# Patient Record
Sex: Male | Born: 1945 | Race: White | Marital: Married
Health system: Southern US, Community
[De-identification: ages and names within clinical notes are randomized; demographics above are authoritative.]

---

## 2020-03-12 ENCOUNTER — Ambulatory Visit (INDEPENDENT_AMBULATORY_CARE_PROVIDER_SITE_OTHER): Payer: Medicare PPO | Admitting: Orthopaedic Surgery

## 2020-03-12 VITALS — Ht 65.0 in | Wt 195.0 lb

## 2020-03-12 DIAGNOSIS — M25562 Pain in left knee: Secondary | ICD-10-CM

## 2020-03-12 DIAGNOSIS — M23321 Other meniscus derangements, posterior horn of medial meniscus, right knee: Secondary | ICD-10-CM

## 2020-03-12 DIAGNOSIS — G8929 Other chronic pain: Secondary | ICD-10-CM | POA: Diagnosis not present

## 2020-03-12 MED ORDER — METHYLPREDNISOLONE ACETATE 40 MG/ML IJ SUSP
40.0000 mg | INTRAMUSCULAR | Status: AC | PRN
  Administered 2020-03-12: 40 mg via INTRA_ARTICULAR

## 2020-03-12 MED ORDER — LIDOCAINE HCL 1 % IJ SOLN
3.0000 mL | INTRAMUSCULAR | Status: AC | PRN
  Administered 2020-03-12: 3 mL

## 2020-03-12 NOTE — Progress Notes (Signed)
Office Visit Note   Patient: Ian Warner           Date of Birth: [birth year]           MRN: [id number] Visit Date: 03/12/2020              Requested by: No referring provider defined for this encounter. PCP: Pcp, No   Assessment & Plan: Visit Diagnoses:  1. Chronic pain of left knee   2. Other meniscus derangements, posterior horn of medial meniscus, right knee     Plan:   I was able to aspirate about 25 cc of fluid from his knee and place a steroid injection in his knee.  He is likely a good candidate for arthroscopic surgery.  I would like to see him back in 2 weeks to see how he responded to this injection.  He did report to me that it is pivoting activities it causes him the most issues which will correlate with a meniscal tear with that in the and correlate with the MRI showing a meniscal tear in the left knee medial meniscus.  All questions and concerns were answered and addressed.  We will see how he is doing in 2 weeks. Follow-Up Instructions: Return in about 2 weeks (around 03/26/2020).   Orders:  Orders Placed This Encounter  Procedures  . Large Joint Inj   No orders of the defined types were placed in this encounter.     Procedures: Large Joint Inj: L knee on 03/12/2020 2:17 PM Indications: diagnostic evaluation and pain Details: 22 G 1.5 in needle, superolateral approach  Arthrogram: No  Medications: 3 mL lidocaine 1 %; 40 mg methylPREDNISolone acetate 40 MG/ML Outcome: tolerated well, no immediate complications Procedure, treatment alternatives, risks and benefits explained, specific risks discussed. Consent was given by the patient. Immediately prior to procedure a time out was called to verify the correct patient, procedure, equipment, support staff and site/side marked as required. Patient was prepped and draped in the usual sterile fashion.       Clinical Data: No additional findings.   Subjective: Chief Complaint  Patient presents with  . Left  Knee - Pain  The patient is referred for evaluation treatment of recurrent effusion of his left knee as well as a known medial meniscal tear in that left knee.  He is a very active 74 year old gentleman who likes to walk a lot with his dog as well as walk for exercise.  He also likes to mow his lawn a lot.  He is not able to to do that given the left knee pain he has had.  He had a twisting injury to that knee earlier this year.  He tried a steroid injection and then later hyaluronic acid injection.  An MRI was obtained showing a posterior horn medial meniscal tear as well as thinning of the cartilage of his knee.  There is no full-thickness cartilage defects.  He still has been having some daily discomfort with his left knee.  He denies any acute changes in medical status.  He is never had surgery on the left knee.  HPI  Review of Systems He currently denies any headache, chest pain, shortness of breath, fever, chills, nausea, vomiting  Objective: Vital Signs: Ht 5\' 5"  (1.651 m)   Wt 195 lb (88.5 kg)   BMI 32.45 kg/m   Physical Exam He is alert and orient x3 and in no acute distress Ortho Exam Examination of his right knee is normal.  Examination of his left knee does show mild to moderate effusion.  There is also medial joint line tenderness and pain when rotating the tibia on the femur internally and externally only on the medial joint line. Specialty Comments:  No specialty comments available.  Imaging: No results found. The plain films of the knee shows only mild arthritic changes.  The MRI of the knee does show moderate effusion with a Baker's cyst as well as signal changes in the posterior horn mid body the medial meniscus.  There is thinning of cartilage in the knee but no full-thickness cartilage defects.  PMFS History: There are no problems to display for this patient.  No past medical history on file.  No family history on file.   Social History   Occupational History  .  Not on file  Tobacco Use  . Smoking status: Not on file  Substance and Sexual Activity  . Alcohol use: Not on file  . Drug use: Not on file  . Sexual activity: Not on file

## 2020-03-28 ENCOUNTER — Ambulatory Visit: Payer: Medicare PPO | Admitting: Orthopaedic Surgery

## 2020-03-29 ENCOUNTER — Ambulatory Visit (INDEPENDENT_AMBULATORY_CARE_PROVIDER_SITE_OTHER): Payer: Medicare PPO | Admitting: Orthopaedic Surgery

## 2020-03-29 ENCOUNTER — Encounter: Payer: Self-pay | Admitting: Orthopaedic Surgery

## 2020-03-29 DIAGNOSIS — G8929 Other chronic pain: Secondary | ICD-10-CM

## 2020-03-29 DIAGNOSIS — M25562 Pain in left knee: Secondary | ICD-10-CM | POA: Diagnosis not present

## 2020-03-29 DIAGNOSIS — S83242D Other tear of medial meniscus, current injury, left knee, subsequent encounter: Secondary | ICD-10-CM | POA: Diagnosis not present

## 2020-03-29 NOTE — Progress Notes (Signed)
The patient is continuing to have intermittent locking catching with his left knee that is activity related.  He is 74 years old and very active.  He has a MRI that did show a medial meniscal tear of his knee but no significant cartilage loss in the knee at all.  He says some days are better than others.  We tried a steroid injection and this did not help.  He still has worked on activity modification.  I talked about the possibility of an arthroscopic intervention for his left knee.  Described in detail what this involves including a discussion of his interoperative and postoperative course.  I talked about the risk and benefits of knee arthroscopy as well.  I do feel this is a neck step for him if he becomes more symptomatic.  He has our surgery scheduler's card and said he would let us know if he would like [place] to schedule him for a left knee arthroscopy with a partial medial meniscectomy.

## 2020-05-10 ENCOUNTER — Other Ambulatory Visit: Payer: Self-pay | Admitting: Orthopaedic Surgery

## 2020-05-10 DIAGNOSIS — S83232D Complex tear of medial meniscus, current injury, left knee, subsequent encounter: Secondary | ICD-10-CM | POA: Diagnosis not present

## 2020-05-10 MED ORDER — HYDROCODONE-ACETAMINOPHEN 5-325 MG PO TABS: 1.0000 | ORAL_TABLET | Freq: Four times a day (QID) | ORAL | 0 refills | Status: DC | PRN

## 2020-05-16 ENCOUNTER — Encounter: Payer: Self-pay | Admitting: Orthopaedic Surgery

## 2020-05-16 ENCOUNTER — Ambulatory Visit (INDEPENDENT_AMBULATORY_CARE_PROVIDER_SITE_OTHER): Payer: Medicare PPO | Admitting: Orthopaedic Surgery

## 2020-05-16 DIAGNOSIS — Z9889 Other specified postprocedural states: Secondary | ICD-10-CM

## 2020-05-16 DIAGNOSIS — G8929 Other chronic pain: Secondary | ICD-10-CM

## 2020-05-16 DIAGNOSIS — S83242D Other tear of medial meniscus, current injury, left knee, subsequent encounter: Secondary | ICD-10-CM

## 2020-05-16 DIAGNOSIS — M25562 Pain in left knee: Secondary | ICD-10-CM

## 2020-05-16 NOTE — Progress Notes (Signed)
The patient is 1 week status post a left knee arthroscopy.  He is a 74 year old gentleman.  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  The rest of his knee look good and side.  He is doing well postoperatively.  He is still having some pain with that left knee.  This is to be expected had just 1 week postop.  On examination he does have a moderate left knee effusion.  I felt appropriate to aspirate his knee and place a steroid injection in today.  He is can work on activity modification and can still do his exercise walking but avoid hills.  I would like to see him back in about 4 weeks.  He is a good candidate for hyaluronic acid in the future if needed.

## 2020-05-17 ENCOUNTER — Other Ambulatory Visit: Payer: Self-pay | Admitting: Orthopaedic Surgery

## 2020-05-17 NOTE — Telephone Encounter (Signed)
Please advise 

## 2020-06-12 ENCOUNTER — Ambulatory Visit (INDEPENDENT_AMBULATORY_CARE_PROVIDER_SITE_OTHER): Payer: Medicare PPO | Admitting: Orthopaedic Surgery

## 2020-06-12 ENCOUNTER — Encounter: Payer: Self-pay | Admitting: Orthopaedic Surgery

## 2020-06-12 DIAGNOSIS — Z9889 Other specified postprocedural states: Secondary | ICD-10-CM

## 2020-06-12 NOTE — Progress Notes (Signed)
HPI: Mr. Cosma returns 5 weeks status post left knee arthroscopy for a complex medial meniscal tear and grade III chondromalacia medial femoral condyle.  He states overall the knee is starting to improve.  He also is having occasional numbness in his calf region and some back pain he brings with him an MRI report that was done at another facility images not available and asked that we look at this that shows that he has degenerative changes in the lumbar spine but only mild foraminal stenosis at the lower levels bilaterally at L5-S1 and mildly on the right at L4-5 per the report.  He denies any radicular symptoms down the leg but does have low back pain at times.  Physical exam: Left knee no abnormal warmth erythema.  Good range of motion with slight crepitus.  Nontender along medial lateral joint line.  No effusion.   Impression: 5 weeks status post left knee arthroscopy Low back pain without radicular pain  Plan: Did give him some exercises for his back to do on his own.  He will discuss with his primary care physician whether he can benefit from outpatient physical therapy as his primary care physician did order the MRI of his back.  Quad strengthening exercises discussed with him.  We will see him back in 3 months he may consider a supplemental injection in the future.  Questions encouraged and answered by Dr. Magnus Ivan and myself.

## 2020-11-21 ENCOUNTER — Telehealth: Payer: Self-pay

## 2020-11-21 ENCOUNTER — Ambulatory Visit (INDEPENDENT_AMBULATORY_CARE_PROVIDER_SITE_OTHER): Payer: Medicare PPO | Admitting: Orthopaedic Surgery

## 2020-11-21 DIAGNOSIS — G8929 Other chronic pain: Secondary | ICD-10-CM | POA: Diagnosis not present

## 2020-11-21 DIAGNOSIS — M25562 Pain in left knee: Secondary | ICD-10-CM

## 2020-11-21 DIAGNOSIS — M1712 Unilateral primary osteoarthritis, left knee: Secondary | ICD-10-CM | POA: Diagnosis not present

## 2020-11-21 NOTE — Telephone Encounter (Signed)
Left knee gel injection ?

## 2020-11-21 NOTE — Progress Notes (Signed)
The patient is well-known to me.  He is an active 75 year old gentleman who is 6 months out from a knee arthroscopy for his left knee.  We found grade III chondromalacia of the medial compartment the knee and a medial meniscal tear.  The ACL and PCL were intact.  The lateral compartment and patellofemoral joint were also intact.  He is still having some knee pain and sometimes pain in his shin.  Is more so going up and down stairs that bothers him.  He denies any locking catching.  He has had no other acute change in medical status.  He is an active 75 year old gentleman.  Examination of his left knee shows no effusion.  There is slight varus malalignment and some medial joint line tenderness.  The knee is ligamentously stable.  It hurts throughout the arc of motion of the knee.  I did go over his arthroscopy pictures.  Given the significant chondromalacia the medial compartment of his knee, at this point he is a candidate for hyaluronic acid for that knee.  He has tried and failed other forms of conservative treatment including activity modification, anti-inflammatories, steroid injections, and even arthroscopic intervention.  Given the arthritic findings this is the next step for him and he agrees with this as well.  We will hopefully submit for hyaluronic acid for the left knee and call him when it is approved and comes in.  All questions and concerns were answered and addressed.

## 2020-11-22 NOTE — Telephone Encounter (Signed)
Noted  

## 2020-12-14 ENCOUNTER — Telehealth: Payer: Self-pay

## 2020-12-14 ENCOUNTER — Telehealth: Payer: Self-pay | Admitting: Orthopaedic Surgery

## 2020-12-14 NOTE — Telephone Encounter (Signed)
Pt called wanting to know where he is in the process of getting approved for the ge injection. He would would like a CB to discuss further.   [phone]

## 2020-12-14 NOTE — Telephone Encounter (Signed)
Approved for Monovisc, left knee. Buy & Bill Patient will be responsible for 20% OOP. No Co-pay PA Approval# [id number] Valid 12/14/2020- 03/14/2021  Appt. 12/20/2020 with Bronson Curb

## 2020-12-14 NOTE — Telephone Encounter (Signed)
Talked patient cocnerning gel injection.  Appt. Scheduled for 12/20/2020

## 2020-12-14 NOTE — Telephone Encounter (Signed)
VOB has been submitted for Monovisc, left knee. Pending BV PA approved for Monovisc, left knee through [facility]

## 2020-12-20 ENCOUNTER — Encounter: Payer: Self-pay | Admitting: Physician Assistant

## 2020-12-20 ENCOUNTER — Ambulatory Visit: Payer: Medicare PPO | Admitting: Physician Assistant

## 2020-12-20 DIAGNOSIS — M1712 Unilateral primary osteoarthritis, left knee: Secondary | ICD-10-CM

## 2020-12-20 MED ORDER — LIDOCAINE HCL 1 % IJ SOLN
4.0000 mL | INTRAMUSCULAR | Status: AC | PRN
  Administered 2020-12-20: 4 mL

## 2020-12-20 MED ORDER — HYALURONAN 88 MG/4ML IX SOSY
88.0000 mg | PREFILLED_SYRINGE | INTRA_ARTICULAR | Status: AC | PRN
  Administered 2020-12-20: 88 mg via INTRA_ARTICULAR

## 2020-12-20 NOTE — Progress Notes (Signed)
   Procedure Note  Patient: Colen Eltzroth             Date of Birth: 07/24/1945           MRN: [id number]             Visit Date: 12/20/2020 HPI: Mr. Buenger returns today for scheduled Monovisc injection left knee.  He also feels that he has fluid on the knee and would like it aspirated.  Again he has known osteoarthritis of the left knee.  On knee arthroscopy he had grade III chondromalacia of the medial compartment of the knee and also had a medial meniscal tear for which he underwent partial medial meniscectomy.  Continues to have pain in the left knee with swelling.  He has had no new injury.  States that the knee pain is limiting his activities.  He has no scheduled knee arthroplasty arthroplasty in the next 6 months.  Physical exam: Left knee good range of motion left knee.  Slight effusion no abnormal warmth or erythema.  No instability valgus varus stressing.  Procedures: Visit Diagnoses:  1. Primary osteoarthritis of left knee     Large Joint Inj: L knee on 12/20/2020 5:22 PM Indications: pain Details: 22 G 1.5 in needle, superolateral approach  Arthrogram: No  Medications: 88 mg Hyaluronan 88 MG/4ML; 4 mL lidocaine 1 % Aspirate: 12 mL yellow Outcome: tolerated well, no immediate complications Procedure, treatment alternatives, risks and benefits explained, specific risks discussed. Consent was given by the patient. Immediately prior to procedure a time out was called to verify the correct patient, procedure, equipment, support staff and site/side marked as required. Patient was prepped and draped in the usual sterile fashion.    Plan: We will see him back in 8 weeks to see what type of response he had to the supplemental injection.  He will continue work on Dance movement psychotherapist.  Discussed knee friendly exercises with him.  Questions encouraged and answered.

## 2021-02-13 ENCOUNTER — Other Ambulatory Visit: Payer: Self-pay

## 2021-02-13 ENCOUNTER — Ambulatory Visit (INDEPENDENT_AMBULATORY_CARE_PROVIDER_SITE_OTHER): Payer: Medicare PPO | Admitting: Orthopaedic Surgery

## 2021-02-13 ENCOUNTER — Encounter: Payer: Self-pay | Admitting: Orthopaedic Surgery

## 2021-02-13 DIAGNOSIS — M1712 Unilateral primary osteoarthritis, left knee: Secondary | ICD-10-CM | POA: Diagnosis not present

## 2021-02-13 NOTE — Progress Notes (Signed)
HPI: Ian Warner returns today for follow-up of his left knee.  He does not feel that the supplemental injections worked much.  Does note some swelling.  He has painful popping in the knee.  Underwent Monovisc injection 12/20/2020.  He also is having some back pain and has radicular symptoms over the same medial aspect of the left lower leg.  So is not quite sure if pain is actually coming from his knee or from the back.  Knee arthroscopy November 21 showed chondromalacia grade 3 involving the medial femoral condyle.  He also had a large meniscal tear which he underwent partial meniscectomy for.  Review of systems see HPI otherwise negative or noncontributory.  Left knee good range of motion.  Tenderness along medial joint line.  Slight effusion no abnormal warmth erythema.  No gross instability.  Patellofemoral crepitus with passive range of motion.  Quad atrophy left leg.  Impression: Left knee osteoarthritis Quad atrophy Low back pain with radicular symptoms   Plan: We will send him to formal physical therapy for his knee.  Also for his back he does see his neurologist in the near future and possible epidural steroid injection.  See him back in 6 weeks to see how he is doing overall.  Questions encouraged and answered at length by Dr. Magnus Ivan and myself.

## 2021-03-22 ENCOUNTER — Other Ambulatory Visit (HOSPITAL_COMMUNITY): Payer: Self-pay | Admitting: Neurological Surgery

## 2021-03-22 DIAGNOSIS — R600 Localized edema: Secondary | ICD-10-CM

## 2021-03-25 ENCOUNTER — Ambulatory Visit (HOSPITAL_COMMUNITY)
Admission: RE | Admit: 2021-03-25 | Discharge: 2021-03-25 | Disposition: A | Discharge status: Home / Self Care | Payer: Medicare PPO | Source: Ambulatory Visit | Attending: Neurological Surgery | Admitting: Neurological Surgery

## 2021-03-25 ENCOUNTER — Other Ambulatory Visit: Payer: Self-pay

## 2021-03-25 DIAGNOSIS — R600 Localized edema: Secondary | ICD-10-CM | POA: Diagnosis present

## 2021-04-03 ENCOUNTER — Ambulatory Visit: Payer: Medicare PPO | Admitting: Orthopaedic Surgery

## 2021-06-19 ENCOUNTER — Ambulatory Visit: Payer: Medicare PPO | Admitting: Orthopaedic Surgery

## 2021-06-19 ENCOUNTER — Other Ambulatory Visit: Payer: Self-pay

## 2021-06-19 DIAGNOSIS — M25562 Pain in left knee: Secondary | ICD-10-CM | POA: Diagnosis not present

## 2021-06-19 DIAGNOSIS — G8929 Other chronic pain: Secondary | ICD-10-CM

## 2021-06-19 DIAGNOSIS — M1712 Unilateral primary osteoarthritis, left knee: Secondary | ICD-10-CM | POA: Diagnosis not present

## 2021-06-19 NOTE — Progress Notes (Signed)
The patient comes in today with continued left knee pain he is 76 years old and very active.  He says some of the knee certainly hurts him quite a bit.  He was seen for radicular symptoms by neurosurgery as it relates to his lumbar spine and it did not feel that there was anything in the need to do for his spine which I think is reasonable based on their notes.  He did have a Doppler to rule out DVT of the left lower extremity.  On exam he still has some left knee swelling.  I have aspirated this knee on several occasions and provided steroid injections and hyaluronic acid injections.  He has had an arthroscopic intervention as well.  We did find grade III chondromalacia of the medial compartment of his knee.  At this point we will continue pain in his knee we need to obtain an MRI of the left knee to assess the cartilage and rule out any type of subchondral edema anterior be able to better ascertain as to the degree of cartilage loss that may lead [place] to recommending a knee replacement.  He agrees with this treatment plan.  We will see him back once we have the MRI of his left knee.  On his follow-up visit I would like to have a standing AP and lateral of his left knee.

## 2021-06-30 ENCOUNTER — Other Ambulatory Visit: Payer: Medicare PPO

## 2021-07-06 ENCOUNTER — Ambulatory Visit
Admission: RE | Admit: 2021-07-06 | Discharge: 2021-07-06 | Disposition: A | Discharge status: Home / Self Care | Payer: Medicare PPO | Source: Ambulatory Visit | Attending: Orthopaedic Surgery | Admitting: Orthopaedic Surgery

## 2021-07-06 ENCOUNTER — Other Ambulatory Visit: Payer: Self-pay

## 2021-07-06 DIAGNOSIS — G8929 Other chronic pain: Secondary | ICD-10-CM

## 2021-07-06 DIAGNOSIS — M25562 Pain in left knee: Secondary | ICD-10-CM

## 2021-07-10 ENCOUNTER — Encounter: Payer: Self-pay | Admitting: Orthopaedic Surgery

## 2021-07-10 ENCOUNTER — Other Ambulatory Visit: Payer: Self-pay

## 2021-07-10 ENCOUNTER — Ambulatory Visit (INDEPENDENT_AMBULATORY_CARE_PROVIDER_SITE_OTHER): Payer: Medicare PPO | Admitting: Orthopaedic Surgery

## 2021-07-10 DIAGNOSIS — M25562 Pain in left knee: Secondary | ICD-10-CM | POA: Diagnosis not present

## 2021-07-10 DIAGNOSIS — G8929 Other chronic pain: Secondary | ICD-10-CM

## 2021-07-10 NOTE — Progress Notes (Signed)
The patient comes in with continued left knee pain and to go over MRI of his left knee.  He was sent to me from neurosurgery due to some neurologic symptoms he was having but his knee did have some swelling.  His pain seems to be global.  Sometimes is medial.  Sometimes he stiff when he gets out of a car.  On my exam today there is really no effusion of his left knee and is patella tracks just slightly lateral he has good range of motion of his knee overall and there is no instability on my exam.  MRIs reviewed and it does not show any tear of the meniscus.  The ligaments including the ACL and PCL are intact.  There is only some slight cartilage thinning throughout the knee but no areas of full-thickness cartilage loss.  There is some edema in Hoffa's fat pad and that is where he may be experiencing some of his pain as well.  Some of his pain is also along the medial joint line and some on the medial femoral condyle.  At this point I would like to send him to my partner Dr. Sammuel Hines for an opinion as it relates to this patient's left knee.  He certainly still may end up benefiting from an arthroscopic intervention for debridement of Hoffa's fat pad and looking for a medial plica.  I would like to defer to my partners expertise in treating this issue.  The patient agrees with a second opinion like this as well.

## 2021-07-12 ENCOUNTER — Ambulatory Visit (HOSPITAL_BASED_OUTPATIENT_CLINIC_OR_DEPARTMENT_OTHER)
Admission: RE | Admit: 2021-07-12 | Discharge: 2021-07-12 | Disposition: A | Discharge status: Home / Self Care | Payer: Medicare PPO | Source: Ambulatory Visit | Attending: Orthopaedic Surgery | Admitting: Orthopaedic Surgery

## 2021-07-12 ENCOUNTER — Other Ambulatory Visit: Payer: Self-pay

## 2021-07-12 ENCOUNTER — Other Ambulatory Visit (HOSPITAL_BASED_OUTPATIENT_CLINIC_OR_DEPARTMENT_OTHER): Payer: Self-pay | Admitting: Orthopaedic Surgery

## 2021-07-12 ENCOUNTER — Ambulatory Visit (INDEPENDENT_AMBULATORY_CARE_PROVIDER_SITE_OTHER): Payer: Medicare PPO | Admitting: Orthopaedic Surgery

## 2021-07-12 DIAGNOSIS — G8929 Other chronic pain: Secondary | ICD-10-CM

## 2021-07-12 DIAGNOSIS — M25562 Pain in left knee: Secondary | ICD-10-CM

## 2021-07-12 DIAGNOSIS — S86112A Strain of other muscle(s) and tendon(s) of posterior muscle group at lower leg level, left leg, initial encounter: Secondary | ICD-10-CM | POA: Diagnosis not present

## 2021-07-12 NOTE — Progress Notes (Signed)
Chief Complaint: Left knee pain     History of Present Illness:    Ian Warner is a 76 y.o. male patient previously seen with Dr. Magnus Ivan who has persistently had left knee pain for several years now.  He did previously undergo medial meniscal debridement without any relief.  He enjoys regularly walking about 3 miles a day although is limited as result of the knee pain.  He has had injections in the past which helped for a small period of time.  He has done physical therapy and home exercises which have not given him significant relief.  He was previously evaluated by neurosurgical team who did not recommend any specific intervention for the back.  Here today for further assessment and evaluation.    Surgical History:   Left knee arthroscopy with medial meniscal debridement 2021 with Dr. Magnus Ivan  PMH/PSH/Family History/Social History/Meds/Allergies:   No past medical history on file. No past surgical history on file. Social History   Socioeconomic History   Marital status: Married    Spouse name: Not on file   Number of children: Not on file   Years of education: Not on file   Highest education level: Not on file  Occupational History   Not on file  Tobacco Use   Smoking status: Not on file   Smokeless tobacco: Not on file  Substance and Sexual Activity   Alcohol use: Not on file   Drug use: Not on file   Sexual activity: Not on file  Other Topics Concern   Not on file  Social History Narrative   Not on file   Social Determinants of Health   Financial Resource Strain: Not on file  Food Insecurity: Not on file  Transportation Needs: Not on file  Physical Activity: Not on file  Stress: Not on file  Social Connections: Not on file   No family history on file. Not on File Current Outpatient Medications  Medication Sig Dispense Refill   HYDROcodone-acetaminophen (NORCO/VICODIN) 5-325 MG tablet TAKE 1-2 TABLETS BY MOUTH EVERY 6  HOURS AS NEEDED FOR MODERATE PAIN 30 tablet 0   No current facility-administered medications for this visit.   No results found.  Review of Systems:   A ROS was performed including pertinent positives and negatives as documented in the HPI.  Physical Exam :   Constitutional: NAD and appears stated age Neurological: Alert and oriented Psych: Appropriate affect and cooperative There were no vitals taken for this visit.   Comprehensive Musculoskeletal Exam:      Musculoskeletal Exam  Gait Normal  Alignment Normal   Right Left  Inspection Normal Normal  Palpation    Tenderness None Lateral head of the gastroc  Crepitus None None  Effusion  None  Range of Motion    Extension 0 0  Flexion 135 135  Strength    Extension 5/5 5/5  Flexion 5/5 5/5  Ligament Exam     Generalized Laxity No No  Lachman Negative Negative   Pivot Shift Negative Negative  Anterior Drawer Negative Negative  Valgus at 0 Negative Negative  Valgus at 20 Negative Negative  Varus at 0 0 0  Varus at 20   0 0  Posterior Drawer at 90 0 0  Vascular/Lymphatic Exam    Edema None None  Venous Stasis Changes No  No  Distal Circulation Normal Normal  Neurologic    Light Touch Sensation Intact Intact  Special Tests: Tenderness predominantly when brought into full extension at the lateral head of the gastroc     Imaging:   Xray (4 views left knee): Normal  MRI (left knee): There is fluid and edema about the lateral head of the gastroc tendon  I personally reviewed and interpreted the radiographs.   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  He does have a tight heel cord on the left compared to the right.  We discussed that ultimately getting his gastroc stretched out I believe we give him the most significant amount of pain relief.  To that effect I would also like to offer him an ultrasound-guided injection into this area to give him some relief and allow him to  better work with physical therapy.  I will see him back in 1 month for reevaluation Plan :    -Return to clinic in 1 month for reevaluation -PT ordered for gastroc lateral head mobilization and eccentric stretching as well as heel cord stretching     I personally saw and evaluated the patient, and participated in the management and treatment plan.  Huel Cote, MD Attending Physician, Orthopedic Surgery  This document was dictated using Dragon voice recognition software. A reasonable attempt at proof reading has been made to minimize errors.

## 2021-07-25 ENCOUNTER — Encounter (HOSPITAL_BASED_OUTPATIENT_CLINIC_OR_DEPARTMENT_OTHER): Payer: Self-pay | Admitting: Physical Therapy

## 2021-07-25 ENCOUNTER — Other Ambulatory Visit: Payer: Self-pay

## 2021-07-25 ENCOUNTER — Ambulatory Visit (HOSPITAL_BASED_OUTPATIENT_CLINIC_OR_DEPARTMENT_OTHER): Payer: Medicare PPO | Attending: Orthopaedic Surgery | Admitting: Physical Therapy

## 2021-07-25 DIAGNOSIS — M25672 Stiffness of left ankle, not elsewhere classified: Secondary | ICD-10-CM | POA: Insufficient documentation

## 2021-07-25 DIAGNOSIS — M25662 Stiffness of left knee, not elsewhere classified: Secondary | ICD-10-CM | POA: Diagnosis not present

## 2021-07-25 DIAGNOSIS — M25562 Pain in left knee: Secondary | ICD-10-CM | POA: Insufficient documentation

## 2021-07-25 DIAGNOSIS — R262 Difficulty in walking, not elsewhere classified: Secondary | ICD-10-CM | POA: Diagnosis not present

## 2021-07-25 DIAGNOSIS — G8929 Other chronic pain: Secondary | ICD-10-CM | POA: Insufficient documentation

## 2021-07-25 DIAGNOSIS — M6281 Muscle weakness (generalized): Secondary | ICD-10-CM | POA: Diagnosis not present

## 2021-07-25 NOTE — Therapy (Signed)
OUTPATIENT PHYSICAL THERAPY LOWER EXTREMITY EVALUATION   Patient Name: Ian Warner MRN: [id number] DOB:04-Aug-1945, 76 y.o., male Today's Date: 07/25/2021   PT End of Session - 07/25/21 1121     Visit Number 1    Number of Visits 10    Date for PT Re-Evaluation 09/05/21    Authorization Type [employer]    PT Start Time 1021    PT Stop Time 1114    PT Time Calculation (min) 53 min    Activity Tolerance Patient tolerated treatment well    Behavior During Therapy [facility] for tasks assessed/performed             History reviewed. No pertinent past medical history. History reviewed. No pertinent surgical history. Patient Active Problem List   Diagnosis Date Noted   Status post arthroscopy of left knee 05/16/2020    PCP: Corrington, Meredith Mody, MD  REFERRING PROVIDER: Huel Cote, MD  REFERRING DIAG: [phone] (ICD-10-CM) - Chronic pain of left knee   THERAPY DIAG:  Chronic pain of left knee  Stiffness of left knee, not elsewhere classified  Muscle weakness (generalized)  Stiffness of left ankle, not elsewhere classified  Difficulty in walking, not elsewhere classified  ONSET DATE: MD visit/script 07/12/2021  SUBJECTIVE:   SUBJECTIVE STATEMENT: -Pt has had left knee pain for a couple of years with no specific MOI.  Pt reports having progressively worsening knee pain.  He has done physical therapy and home exercises which did not help.  Pt has L knee OA and was seeing Dr. Magnus Ivan.  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  Pt has received prior knee Monovisc injection and knee aspirations which didn't provide relief.  Pt reports no relief with surgery, injection, and aspirations.    -Dr. Magnus Ivan ordered a MRI and went over MRI with Pt.  MD note indicated some edema in Hoffa's fat pad and that is where he may be experiencing some of his pain as well. Some  of his pain is also along the medial joint line and some on the medial femoral condyle.   He then referred pt to Dr. Steward Drone.  Dr. 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  MD ordered PT with MD note indicated gastroc lateral head mobilization and eccentric stretching as well as heel cord stretching for L gastrocnemius tendonitis.  He received an US guided injection and reports improved sx's since injection.   -Pt states his wife states that he is sleeping better since the injection and he reports improved pain with sitting since injection.  Pt states his pain is primarily in posteromedial knee and travels down calf toward ankle.  Pt has increased pain with performing stairs and ambulation.  Pt is limited with ambulation due to pain.  Pt is limited with standing duration due to pain and limits his performance of stairs.  Difficulty with vacuuming.   Pt states he has had back pain for a few years and recently was evaluated by neurosurgery, Dr. Danielle Dess.  He was given ESI's and reports improved back pain since.    PERTINENT HISTORY: L knee OA, L knee arthroscopy with partial medial meniscectomy on December 2021, back pain  PAIN:  Are you having pain? Yes NPRS scale: 3/10 current and best ; 9/10 worst Pain location: L post  knee, L calf, and anterior knee Pain description: constant  Aggravating factors: standing, stairs, walking Relieving factors: US guided injection  PRECAUTIONS: L knee OA, hx of back pain  WEIGHT BEARING RESTRICTIONS No  FALLS:  Has patient fallen in last 6 months? No  LIVING ENVIRONMENT: Lives with: lives with their spouse Lives in: 2 story home Stairs: Yes   OCCUPATION: Pt is retired.   PLOF: Independent  PATIENT GOALS eliminate pain   OBJECTIVE:   DIAGNOSTIC FINDINGS:  MRI:  IMPRESSION: 1. Expanded ACL favoring ACL cyst with ACL  degeneration. 2. Moderate knee effusion and moderate-sized Baker's cyst. 3. Mild fibrosis in Hoffa's fat pad. 4. Degeneration in the posterior horn medial meniscus without a well-defined tear. 5. Moderate to prominent degenerative chondral thinning in the medial compartment. Mild chondral thinning in the lateral compartment. 6. Mild patellar tendinopathy.  X rays: IMPRESSION: Mild degenerative change with medial tibiofemoral joint space narrowing and trace lateral patellar spurring. Small to moderate knee joint effusion.  PATIENT SURVEYS:  FOTO 56 with a goal of 64 at visit #13  COGNITION:  Overall cognitive status: Within functional limits for tasks assessed      MUSCLE LENGTH: Tightness in L longsitting gastroc stretch.  Pt felt a stretch on L and none on R.     PALPATION: TTP:  lateral proximal calf > medial proximal calf, posterior knee, and minimally in lateral knee.  LE AROM/PROM:  A/PROM Right 07/25/2021 Left 07/25/2021  Hip flexion    Hip extension    Hip abduction    Hip adduction    Hip internal rotation    Hip external rotation    Knee flexion 130 115/117  Knee extension 0 3/0  Ankle dorsiflexion 17 10  Ankle plantarflexion    Ankle inversion    Ankle eversion     (Blank rows = not tested)  LE MMT:  MMT Right 07/25/2021 Left 07/25/2021  Hip flexion 4+/5 4/5  Hip extension    Hip abduction 5/5 4/5  Hip adduction    Hip internal rotation    Hip external rotation    Knee flexion 5/5 tested in sitting 5/5 tested in sitting  Knee extension 5/5 4/5  Ankle dorsiflexion    Ankle plantarflexion    Ankle inversion    Ankle eversion     (Blank rows = not tested)    GAIT: Assistive device utilized: None Level of assistance: Complete Independence Comments: Pt ambulates with a forward flexed posture and minimally favors L LE with increased stance time on R LE.  He reports 5/10 pain with ambulation    TODAY'S TREATMENT: Pt performed longsitting  gastroc stretch with strap 2x20-30 seconds, standing gastroc stretch on wall 2x20-30 sec,  And supine SLR x10 reps.     PATIENT EDUCATION:  Education details:  Dx, POC, objective findings, rationale of exercises.  Pt received a HEP handout and was educated in correct form and appropriate frequency.  Pt instructed to not perform stretching into a painful range and he should not have increased pain with HEP.   Person educated: Patient Education method: Explanation, Demonstration, Tactile cues, Verbal cues, and Handouts Education comprehension: verbalized understanding, returned demonstration, verbal cues required, tactile cues required, and needs further education   HOME EXERCISE PROGRAM: Access Code: [email or web address] URL: [email or web address] Date: 07/25/2021 Prepared by: Aaron Edelman  Exercises Long Sitting Calf Stretch with Strap - 2 x daily - 7 x weekly - 2 reps - 20-30 second hold Gastroc Stretch on Wall - 2 x  daily - 7 x weekly - 2 reps - 20-30 seconds hold Supine Active Straight Leg Raise - 1 x daily - 5-6 x weekly - 2 sets - 10 reps   ASSESSMENT:  CLINICAL IMPRESSION: Patient is a 76 y.o. 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  Pt has had chronic pain and had no relief with surgery, prior injection and aspirations, and prior PT.  Pt received an US guided injection recently from MD and reports much improved sx's.  Pt has increased pain with performing stairs and ambulation and is limited with ambulation due to pain.  Pt is limited with standing duration due to pain and limits his performance of stairs.  Pt was recently evaluated by neurosurgery due to back pain and received ESI's which have improved back pain.  Pt should benefit from skilled PT services to address impairments and improve overall function.          OBJECTIVE IMPAIRMENTS Abnormal gait, decreased activity tolerance, decreased endurance, decreased mobility, difficulty walking, decreased ROM, decreased strength, hypomobility, impaired flexibility, and pain.   ACTIVITY LIMITATIONS  ambulation, stairs, and vacuuming .   PERSONAL FACTORS Time since onset of injury/illness/exacerbation and 1 comorbidity: back pain  are also affecting patient's functional outcome.    REHAB POTENTIAL: Good  CLINICAL DECISION MAKING: Stable/uncomplicated  EVALUATION COMPLEXITY: Low   GOALS:   SHORT TERM GOALS:  STG Name Target Date Goal status  1 Pt will be independent and compliant with HEP for improved pain, ROM, strength, and function.  Baseline:  08/15/2021 INITIAL  2 Pt will demo improved L knee extension AROM to 0 deg and L ankle DF AROM to at least 15 deg for improved mobility, stiffness, and gait.  Baseline:  08/15/2021 INITIAL  3 Pt will report at least a 25% improvement in pain with standing activities and ambulation. Baseline: 08/15/2021 INITIAL                       LONG TERM GOALS:   LTG Name Target Date Goal status  1 Pt will ambulate with = stance time and not favoring L LE.   Baseline: 09/05/2021 INITIAL  2 Pt will demo improved L hip flex, hip abd, and knee ext strength to 5/5 MMT for improved tolerance with and performance of functional mobility.  Baseline: 09/05/2021 INITIAL  3 Pt will report improved tolerance with standing activities including being able to perform his household chores and IADLs without significant pain.  Baseline: 09/05/2021 INITIAL  4 Pt will be able to ambulate extended community distance without increased knee or L LE pain.  Baseline: 09/05/2021 INITIAL  5 Pt will report he is able to perform stairs without significant pain and demo good control with stairs.  Baseline: 09/05/2021 INITIAL             PLAN: PT FREQUENCY: 1-2x/week  PT DURATION: 6 weeks  PLANNED INTERVENTIONS: Therapeutic exercises,  Therapeutic activity, Neuro Muscular re-education, Gait training, Patient/Family education, Joint mobilization, Stair training, Aquatic Therapy, Dry Needling, Electrical stimulation, Cryotherapy, Moist heat, Taping, Ultrasound, and Manual therapy  PLAN FOR NEXT SESSION: Review HEP.  MD note indicated stretching and lateral head mobiliation achilles stretching for L gastrocnemius tendonitis.  Cont with knee ROM also and quad/hip strengthening.  Manual techniques for improved tightness  and mobility.    Audie Clear III PT, DPT 07/25/21 10:06 PM

## 2021-07-31 ENCOUNTER — Ambulatory Visit (HOSPITAL_BASED_OUTPATIENT_CLINIC_OR_DEPARTMENT_OTHER): Payer: Medicare PPO | Admitting: Physical Therapy

## 2021-07-31 ENCOUNTER — Other Ambulatory Visit: Payer: Self-pay

## 2021-07-31 ENCOUNTER — Encounter (HOSPITAL_BASED_OUTPATIENT_CLINIC_OR_DEPARTMENT_OTHER): Payer: Self-pay | Admitting: Physical Therapy

## 2021-07-31 DIAGNOSIS — M6281 Muscle weakness (generalized): Secondary | ICD-10-CM

## 2021-07-31 DIAGNOSIS — G8929 Other chronic pain: Secondary | ICD-10-CM

## 2021-07-31 DIAGNOSIS — M25662 Stiffness of left knee, not elsewhere classified: Secondary | ICD-10-CM

## 2021-07-31 DIAGNOSIS — M25562 Pain in left knee: Secondary | ICD-10-CM | POA: Diagnosis not present

## 2021-07-31 NOTE — Therapy (Signed)
OUTPATIENT PHYSICAL THERAPY TREATMENT NOTE   Patient Name: Ian Warner MRN: [id number] DOB:1946-05-10, 76 y.o., male Today's Date: 07/31/2021  PCP: Vivien Presto, MD REFERRING PROVIDER: Huel Cote, MD   PT End of Session - 07/31/21 1428     Visit Number 2    Number of Visits 10    Date for PT Re-Evaluation 09/05/21    Authorization Type [employer]    PT Start Time 1430    PT Stop Time 1511    PT Time Calculation (min) 41 min    Activity Tolerance Patient tolerated treatment well    Behavior During Therapy [facility] for tasks assessed/performed             History reviewed. No pertinent past medical history. History reviewed. No pertinent surgical history. Patient Active Problem List   Diagnosis Date Noted   Status post arthroscopy of left knee 05/16/2020    REFERRING DIAG: M25.562,G89.29 (ICD-10-CM) - Chronic pain of left knee  THERAPY DIAG:  Chronic pain of left knee  Stiffness of left knee, not elsewhere classified  Muscle weakness (generalized)  PERTINENT HISTORY: L knee OA, L knee arthroscopy with partial medial meniscectomy on December 2021, back pain  PRECAUTIONS: L knee OA, hx of back pain  SUBJECTIVE: stretches were good and I have been doing them. I have 2 dogs that I walk daily.   PAIN:  Are you having pain? Yes NPRS scale: 5/10 Pain location: medial, left calf  Pain description: sharp, stabbing, numbness Aggravating factors: being in a car Relieving factors: stretching helped some    OBJECTIVE:    DIAGNOSTIC FINDINGS:  MRI:  IMPRESSION: 1. 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 2. Moderate knee effusion and moderate-sized Baker's cyst. 3. Mild fibrosis in Hoffa's fat pad. 4. Degeneration in the posterior horn medial meniscus without a well-defined tear. 5. Moderate to prominent degenerative chondral thinning in the medial compartment. Mild chondral thinning in the lateral compartment. 6. Mild patellar  tendinopathy.   X rays: IMPRESSION: Mild degenerative change with medial tibiofemoral joint space narrowing and trace lateral patellar spurring. Small to moderate knee joint effusion.   PATIENT SURVEYS:  FOTO 56 with a goal of 64 at visit #13   COGNITION:          Overall cognitive status: Within functional limits for tasks assessed                          MUSCLE LENGTH: Tightness in L longsitting gastroc stretch.  Pt felt a stretch on L and none on R.        PALPATION: TTP:  lateral proximal calf > medial proximal calf, posterior knee, and minimally in lateral knee.   LE AROM/PROM:   A/PROM Right 07/25/2021 Left 07/25/2021  Hip flexion      Hip extension      Hip abduction      Hip adduction      Hip internal rotation      Hip external rotation      Knee flexion 130 115/117  Knee extension 0 3/0  Ankle dorsiflexion 17 10  Ankle plantarflexion      Ankle inversion      Ankle eversion       (Blank rows = not tested)   LE MMT:   MMT Right 07/25/2021 Left 07/25/2021  Hip flexion 4+/5 4/5  Hip extension      Hip abduction 5/5 4/5  Hip adduction  Hip internal rotation      Hip external rotation      Knee flexion 5/5 tested in sitting 5/5 tested in sitting  Knee extension 5/5 4/5  Ankle dorsiflexion      Ankle plantarflexion      Ankle inversion      Ankle eversion       (Blank rows = not tested)       GAIT: Assistive device utilized: None Level of assistance: Complete Independence Comments: Pt ambulates with a forward flexed posture and minimally favors L LE with increased stance time on R LE.  He reports 5/10 pain with ambulation       TODAY'S TREATMENT:  2/22: MANUAL- IASTM & STM Lt gastroc/soleus Gastroc standing lunge & using curb Seated HS stretch Supine SLR- cues to keep hip in neutral, rather than adducted Supine SLR with leg turned out Sidelying hip abduction- progressed to circles Eccentric heel lower  EVAL: Pt performed  longsitting gastroc stretch with strap 2x20-30 seconds, standing gastroc stretch on wall 2x20-30 sec,  And supine SLR x10 reps.       PATIENT EDUCATION:  Education details:  anatomy of condition, exercise form/rationale Person educated: Patient Education method: Explanation, Demonstration, Tactile cues, Verbal cues, and Handouts Education comprehension: verbalized understanding, returned demonstration, verbal cues required, tactile cues required, and needs further education     HOME EXERCISE PROGRAM: Access Code: [email or web address] URL: [email or web address]      ASSESSMENT:   CLINICAL IMPRESSION:  Pt did very well after last session so exercises were progressed for proximal stability and eccentric lengthening for calf. Denied increase in pain. Notable trigger points found in gastroc region- may benefit from dry needling.         OBJECTIVE IMPAIRMENTS Abnormal gait, decreased activity tolerance, decreased endurance, decreased mobility, difficulty walking, decreased ROM, decreased strength, hypomobility, impaired flexibility, and pain.    ACTIVITY LIMITATIONS  ambulation, stairs, and vacuuming .    PERSONAL FACTORS Time since onset of injury/illness/exacerbation and 1 comorbidity: back pain  are also affecting patient's functional outcome.      REHAB POTENTIAL: Good   CLINICAL DECISION MAKING: Stable/uncomplicated   EVALUATION COMPLEXITY: Low     GOALS:     SHORT TERM GOALS:   STG Name Target Date Goal status  1 Pt will be independent and compliant with HEP for improved pain, ROM, strength, and function.  Baseline:  08/15/2021 INITIAL  2 Pt will demo improved L knee extension AROM to 0 deg and L ankle DF AROM to at least 15 deg for improved mobility, stiffness, and gait.  Baseline:  08/15/2021 INITIAL  3 Pt will report at least a 25% improvement in pain with standing activities and ambulation. Baseline: 08/15/2021 INITIAL                                         LONG TERM GOALS:    LTG Name Target Date Goal status  1 Pt will ambulate with = stance time and not favoring L LE.   Baseline: 09/05/2021 INITIAL  2 Pt will demo improved L hip flex, hip abd, and knee ext strength to 5/5 MMT for improved tolerance with and performance of functional mobility.  Baseline: 09/05/2021 INITIAL  3 Pt will report improved tolerance with standing activities including being able to perform his household chores and IADLs without significant pain.  Baseline: 09/05/2021 INITIAL  4 Pt will  be able to ambulate extended community distance without increased knee or L LE pain.  Baseline: 09/05/2021 INITIAL  5 Pt will report he is able to perform stairs without significant pain and demo good control with stairs.  Baseline: 09/05/2021 INITIAL                      PLAN: PT FREQUENCY: 1-2x/week   PT DURATION: 6 weeks   PLANNED INTERVENTIONS: Therapeutic exercises, Therapeutic activity, Neuro Muscular re-education, Gait training, Patient/Family education, Joint mobilization, Stair training, Aquatic Therapy, Dry Needling, Electrical stimulation, Cryotherapy, Moist heat, Taping, Ultrasound, and Manual therapy   PLAN FOR NEXT SESSION: progress hip abduction strengthening, consider DN?   Raygen Dahm C. Lai Hendriks PT, DPT 07/31/21 3:14 PM

## 2021-08-07 ENCOUNTER — Ambulatory Visit (HOSPITAL_BASED_OUTPATIENT_CLINIC_OR_DEPARTMENT_OTHER): Payer: Medicare PPO | Admitting: Orthopaedic Surgery

## 2021-08-07 ENCOUNTER — Other Ambulatory Visit: Payer: Self-pay

## 2021-08-07 DIAGNOSIS — S86112A Strain of other muscle(s) and tendon(s) of posterior muscle group at lower leg level, left leg, initial encounter: Secondary | ICD-10-CM

## 2021-08-07 NOTE — Progress Notes (Signed)
? ?                            ? ? ?Chief Complaint: Left knee pain ?  ? ? ?History of Present Illness:  ? ?08/07/2021: Presents today for follow-up of the left knee.  He does state that he did get good relief from his left medial gastroc injection.  He has been working on physical therapy for stretching of this.  Overall he is making improvement.  He did have some soreness after he went to mow the lawn the week prior. ? ?Ian Warner is a 76 y.o. male patient previously seen with Dr. Magnus Ivan who has persistently had left knee pain for several years now.  He did previously undergo medial meniscal debridement without any relief.  He enjoys regularly walking about 3 miles a day although is limited as result of the knee pain.  He has had injections in the past which helped for a small period of time.  He has done physical therapy and home exercises which have not given him significant relief.  He was previously evaluated by neurosurgical team who did not recommend any specific intervention for the back.  Here today for further assessment and evaluation. ? ? ? ?Surgical History:   ?Left knee arthroscopy with medial meniscal debridement 2021 with Dr. Magnus Ivan ? ?PMH/PSH/Family History/Social History/Meds/Allergies:   ?No past medical history on file. ?No past surgical history on file. ?Social History  ? ?Socioeconomic History  ? Marital status: Married  ?  Spouse name: Not on file  ? Number of children: Not on file  ? Years of education: Not on file  ? Highest education level: Not on file  ?Occupational History  ? Not on file  ?Tobacco Use  ? Smoking status: Not on file  ? Smokeless tobacco: Not on file  ?Substance and Sexual Activity  ? Alcohol use: Not on file  ? Drug use: Not on file  ? Sexual activity: Not on file  ?Other Topics Concern  ? Not on file  ?Social History Narrative  ? Not on file  ? ?Social Determinants of Health  ? ?Financial Resource Strain: Not on file  ?Food Insecurity: Not on file  ?Transportation  Needs: Not on file  ?Physical Activity: Not on file  ?Stress: Not on file  ?Social Connections: Not on file  ? ?No family history on file. ?Not on File ?Current Outpatient Medications  ?Medication Sig Dispense Refill  ? doxepin (SINEQUAN) 150 MG capsule Take 150 mg by mouth at bedtime.    ? HYDROcodone-acetaminophen (NORCO/VICODIN) 5-325 MG tablet TAKE 1-2 TABLETS BY MOUTH EVERY 6 HOURS AS NEEDED FOR MODERATE PAIN (Patient not taking: Reported on 07/25/2021) 30 tablet 0  ? lisinopril (ZESTRIL) 40 MG tablet Take 40 mg by mouth daily.    ? ?No current facility-administered medications for this visit.  ? ?No results found. ? ?Review of Systems:   ?A ROS was performed including pertinent positives and negatives as documented in the HPI. ? ?Physical Exam :   ?Constitutional: NAD and appears stated age ?Neurological: Alert and oriented ?Psych: Appropriate affect and cooperative ?There were no vitals taken for this visit.  ? ?Comprehensive Musculoskeletal Exam:   ? ?  ?Musculoskeletal Exam  ?Gait Normal  ?Alignment Normal  ? Right Left  ?Inspection Normal Normal  ?Palpation    ?Tenderness None Lateral head of the gastroc  ?Crepitus None None  ?Effusion  None  ?Range of  Motion    ?Extension 0 0  ?Flexion 135 135  ?Strength    ?Extension 5/5 5/5  ?Flexion 5/5 5/5  ?Ligament Exam     ?Generalized Laxity No No  ?Lachman Negative Negative   ?Pivot Shift Negative Negative  ?Anterior Drawer Negative Negative  ?Valgus at 0 Negative Negative  ?Valgus at 20 Negative Negative  ?Varus at 0 0 0  ?Varus at 20   0 0  ?Posterior Drawer at 90 0 0  ?Vascular/Lymphatic Exam    ?Edema None None  ?Venous Stasis Changes No No  ?Distal Circulation Normal Normal  ?Neurologic    ?Light Touch Sensation Intact Intact  ?Special Tests: Tenderness predominantly when brought into full extension at the lateral head of the gastroc  ? ? ? ?Imaging:   ?Xray (4 views left knee): ?Normal ? ?MRI (left knee): ?There is fluid and edema about the lateral head of  the gastroc tendon ? ?I personally reviewed and interpreted the radiographs. ? ? 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  He does have a tight heel cord on the left compared to the right.  At this time I would like him to continue to work on physical therapy and build this into a home program that he can work on routinely.  I will see him back in 2 months for recheck ?Plan :   ? ?-Return to clinic in 2 months for recheck ? ? ? ? ?I personally saw and evaluated the patient, and participated in the management and treatment plan. ? ?Huel Cote, MD ?Attending Physician, Orthopedic Surgery ? ?This document was dictated using Conservation officer, historic buildings. A reasonable attempt at proof reading has been made to minimize errors. ?

## 2021-08-08 ENCOUNTER — Encounter (HOSPITAL_BASED_OUTPATIENT_CLINIC_OR_DEPARTMENT_OTHER): Payer: Self-pay | Admitting: Physical Therapy

## 2021-08-21 ENCOUNTER — Ambulatory Visit (HOSPITAL_BASED_OUTPATIENT_CLINIC_OR_DEPARTMENT_OTHER): Payer: Medicare PPO | Attending: Orthopaedic Surgery | Admitting: Physical Therapy

## 2021-08-21 ENCOUNTER — Other Ambulatory Visit: Payer: Self-pay

## 2021-08-21 ENCOUNTER — Encounter (HOSPITAL_BASED_OUTPATIENT_CLINIC_OR_DEPARTMENT_OTHER): Payer: Self-pay | Admitting: Physical Therapy

## 2021-08-21 DIAGNOSIS — M6281 Muscle weakness (generalized): Secondary | ICD-10-CM

## 2021-08-21 DIAGNOSIS — M25562 Pain in left knee: Secondary | ICD-10-CM | POA: Insufficient documentation

## 2021-08-21 DIAGNOSIS — M25662 Stiffness of left knee, not elsewhere classified: Secondary | ICD-10-CM

## 2021-08-21 DIAGNOSIS — G8929 Other chronic pain: Secondary | ICD-10-CM

## 2021-08-21 NOTE — Therapy (Signed)
?OUTPATIENT PHYSICAL THERAPY TREATMENT NOTE ? ? ?Patient Name: Ian Warner ?MRN: [id number] ?DOB:[birth year], 76 y.o., male ?Today's Date: 08/21/2021 ? ?PCP: Vivien Prestoorrington, Kip A, MD ?REFERRING PROVIDER: Corrington, Kip A, MD ? ? PT End of Session - 08/21/21 1014   ? ? Visit Number 3   ? Number of Visits 10   ? Date for PT Re-Evaluation 09/05/21   ? Authorization Type [employer]   ? PT Start Time 1014   ? PT Stop Time 1056   ? PT Time Calculation (min) 42 min   ? Activity Tolerance Patient tolerated treatment well   ? Behavior [facility] for tasks assessed/performed   ? ?  ?  ? ?  ? ? ? ?History reviewed. No pertinent past medical history. ?History reviewed. No pertinent surgical history. ?Patient Active Problem List  ? Diagnosis Date Noted  ? Status post arthroscopy of left knee 05/16/2020  ? ? ?REFERRING DIAG: M25.562,G89.29 (ICD-10-CM) - Chronic pain of left knee ? ?THERAPY DIAG:  ?Chronic pain of left knee ? ?Stiffness of left knee, not elsewhere classified ? ?Muscle weakness (generalized) ? ?PERTINENT HISTORY: L knee OA, L knee arthroscopy with partial medial meniscectomy on December 2021, back pain ? ?PRECAUTIONS: L knee OA, hx of back pain ? ?SUBJECTIVE: denied soreness after last visit. Did a lot of work in the yard yesterday resetting border stones. ?PAIN:  ?Are you having pain? Yes ?NPRS scale: 4/10 ?Pain location: medial, left calf ? ?Pain description: sharp, stabbing, numbness ?Aggravating factors: being in a car ?Relieving factors: stretching helped some ? ? ? ?OBJECTIVE:  ?  ?DIAGNOSTIC FINDINGS:  ?MRI:  IMPRESSION: ?1. 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 ?2. Moderate knee effusion and moderate-sized Baker's cyst. ?3. Mild fibrosis in Hoffa's fat pad. ?4. Degeneration in the posterior horn medial meniscus without a ?well-defined tear. ?5. Moderate to prominent degenerative chondral thinning in the ?medial compartment. Mild chondral thinning in the lateral ?compartment. ?6.  Mild patellar tendinopathy. ?  ?X rays: ?IMPRESSION: ?Mild degenerative change with medial tibiofemoral joint space ?narrowing and trace lateral patellar spurring. Small to moderate ?knee joint effusion. ?  ?PATIENT SURVEYS:  ?FOTO 56 with a goal of 64 at visit #13 ?  ?COGNITION: ?         Overall cognitive status: Within functional limits for tasks assessed              ?          ?  ?MUSCLE LENGTH: ?Tightness in L longsitting gastroc stretch.  Pt felt a stretch on L and none on R.  ?  ?  ?  ?PALPATION: ?TTP:  lateral proximal calf > medial proximal calf, posterior knee, and minimally in lateral knee. ?  ?LE AROM/PROM: ?  ?A/PROM Right ?07/25/2021 Left ?07/25/2021 LEFT ?3/15  ?Hip flexion       ?Hip extension       ?Hip abduction       ?Hip adduction       ?Hip internal rotation       ?Hip external rotation       ?Knee flexion 130 115/117 120  ?Knee extension 0 3/0   ?Ankle dorsiflexion 17 10 10   ?Ankle plantarflexion       ?Ankle inversion       ?Ankle eversion       ? (Blank rows = not tested) ?  ?LE MMT: ?  ?MMT Right ?07/25/2021 Left ?07/25/2021  ?Hip flexion 4+/5 4/5  ?Hip extension      ?  Hip abduction 5/5 4/5  ?Hip adduction      ?Hip internal rotation      ?Hip external rotation      ?Knee flexion 5/5 tested in sitting 5/5 tested in sitting  ?Knee extension 5/5 4/5  ?Ankle dorsiflexion      ?Ankle plantarflexion      ?Ankle inversion      ?Ankle eversion      ? (Blank rows = not tested) ?  ?  ?  ?GAIT: ?Assistive device utilized: None ?Level of assistance: Complete Independence ?Comments: Pt ambulates with a forward flexed posture and minimally favors L LE with increased stance time on R LE.  He reports 5/10 pain with ambulation ?  ?  ?  ?TODAY'S TREATMENT: ? ?3/15: ?MANUAL: STM Lt gastroc- midline with most trigger points ?Supine HSS ?Chair lunge for knee flexion mobility ?SLS with level pelvis ?Deep squat for soleus stretch & knee flexion ?Single leg hip extension in plank on bar ?Standing glut sets ?Bridge  with ball bw knees ? ?2/22: ?MANUAL- IASTM & STM Lt gastroc/soleus ?Gastroc standing lunge & using curb ?Seated HS stretch ?Supine SLR- cues to keep hip in neutral, rather than adducted ?Supine SLR with leg turned out ?Sidelying hip abduction- progressed to circles ?Eccentric heel lower ? ?EVAL: ?Pt performed longsitting gastroc stretch with strap 2x20-30 seconds, standing gastroc stretch on wall 2x20-30 sec,  ?And supine SLR x10 reps.   ?  ?  ?PATIENT EDUCATION:  ?Education details:  anatomy of condition, exercise form/rationale ?Person educated: Patient ?Education method: Explanation, Demonstration, Tactile cues, Verbal cues, and Handouts ?Education comprehension: verbalized understanding, returned demonstration, verbal cues required, tactile cues required, and needs further education ?  ?  ?HOME EXERCISE PROGRAM: ?Access Code: [email or web address] ?URL: [email or web address] ? ?  ?  ?ASSESSMENT: ?  ?CLINICAL IMPRESSION: ? ?Felt soreness in calf after manual therapy as expected. Continues to have discomfort especially in end range knee flexion. Activated hip abd group in SLS to avoid trendelenburg pattern placing strain on knee. Will f/u in 2 weeks and will need recert. Continue to consider if cyst in knee is contributing to gastroc tightness.  ? ?  ?  ?  ?OBJECTIVE IMPAIRMENTS Abnormal gait, decreased activity tolerance, decreased endurance, decreased mobility, difficulty walking, decreased ROM, decreased strength, hypomobility, impaired flexibility, and pain.  ?  ?ACTIVITY LIMITATIONS  ambulation, stairs, and vacuuming .  ?  ?PERSONAL FACTORS Time since onset of injury/illness/exacerbation and 1 comorbidity: back pain  are also affecting patient's functional outcome.  ?  ?  ?REHAB POTENTIAL: Good ?  ?CLINICAL DECISION MAKING: Stable/uncomplicated ?  ?EVALUATION COMPLEXITY: Low ?  ?  ?GOALS: ?  ?  ?SHORT TERM GOALS: ?  ?STG Name Target Date Goal status  ?1 Pt will be independent and compliant with HEP for  improved pain, ROM, strength, and function.  ?Baseline:  08/15/2021 achieved  ?2 Pt will demo improved L knee extension AROM to 0 deg and L ankle DF AROM to at least 15 deg for improved mobility, stiffness, and gait.  ?Baseline: 10 deg on 3/15 08/15/2021 ongoing  ?3 Pt will report at least a 25% improvement in pain with standing activities and ambulation. ?Baseline: 08/15/2021 achieved  ?         ?         ?         ?         ?  ?LONG TERM GOALS:  ?  ?LTG Name Target Date Goal status  ?  1 Pt will ambulate with = stance time and not favoring L LE.   ?Baseline: 09/05/2021 INITIAL  ?2 Pt will demo improved L hip flex, hip abd, and knee ext strength to 5/5 MMT for improved tolerance with and performance of functional mobility.  ?Baseline: 09/05/2021 INITIAL  ?3 Pt will report improved tolerance with standing activities including being able to perform his household chores and IADLs without significant pain.  ?Baseline: 09/05/2021 INITIAL  ?4 Pt will be able to ambulate extended community distance without increased knee or L LE pain.  ?Baseline: 09/05/2021 INITIAL  ?5 Pt will report he is able to perform stairs without significant pain and demo good control with stairs.  ?Baseline: 09/05/2021 INITIAL  ?         ?         ?  ?PLAN: ?PT FREQUENCY: 1-2x/week ?  ?PT DURATION: 6 weeks ?  ?PLANNED INTERVENTIONS: Therapeutic exercises, Therapeutic activity, Neuro Muscular re-education, Gait training, Patient/Family education, Joint mobilization, Stair training, Aquatic Therapy, Dry Needling, Electrical stimulation, Cryotherapy, Moist heat, Taping, Ultrasound, and Manual therapy ?  ?PLAN FOR NEXT SESSION: glut strength for upright posture, knee flexion and ext flexibility/mobility, ankle stability on unstable surface ? ? ?Mordecai Tindol C. Callen Vancuren PT, DPT ?08/21/21 10:57 AM ? ? ?  ? ?

## 2021-09-25 ENCOUNTER — Ambulatory Visit (HOSPITAL_BASED_OUTPATIENT_CLINIC_OR_DEPARTMENT_OTHER): Payer: Medicare PPO | Admitting: Orthopaedic Surgery

## 2021-09-25 DIAGNOSIS — S86112A Strain of other muscle(s) and tendon(s) of posterior muscle group at lower leg level, left leg, initial encounter: Secondary | ICD-10-CM

## 2021-09-25 DIAGNOSIS — M25562 Pain in left knee: Secondary | ICD-10-CM

## 2021-09-25 MED ORDER — LIDOCAINE HCL 1 % IJ SOLN
4.0000 mL | INTRAMUSCULAR | Status: AC | PRN
  Administered 2021-09-25: 4 mL

## 2021-09-25 MED ORDER — TRIAMCINOLONE ACETONIDE 40 MG/ML IJ SUSP
80.0000 mg | INTRAMUSCULAR | Status: AC | PRN
  Administered 2021-09-25: 80 mg via INTRA_ARTICULAR

## 2021-09-25 NOTE — Progress Notes (Signed)
? ?                            ? ? ?Chief Complaint: Left knee pain ?  ? ? ?History of Present Illness:  ? ?09/25/2021: Presents today for follow-up of his left knee.  Unfortunately he did have a fall on this recently and has a fall for the last several days with knee pain. ? ?Ian Warner is a 76 y.o. male patient previously seen with Dr. Magnus Ivan who has persistently had left knee pain for several years now.  He did previously undergo medial meniscal debridement without any relief.  He enjoys regularly walking about 3 miles a day although is limited as result of the knee pain.  He has had injections in the past which helped for a small period of time.  He has done physical therapy and home exercises which have not given him significant relief.  He was previously evaluated by neurosurgical team who did not recommend any specific intervention for the back.  Here today for further assessment and evaluation. ? ? ? ?Surgical History:   ?Left knee arthroscopy with medial meniscal debridement 2021 with Dr. Magnus Ivan ? ?PMH/PSH/Family History/Social History/Meds/Allergies:   ?No past medical history on file. ?No past surgical history on file. ?Social History  ? ?Socioeconomic History  ?? Marital status: Married  ?  Spouse name: Not on file  ?? Number of children: Not on file  ?? Years of education: Not on file  ?? Highest education level: Not on file  ?Occupational History  ?? Not on file  ?Tobacco Use  ?? Smoking status: Not on file  ?? Smokeless tobacco: Not on file  ?Substance and Sexual Activity  ?? Alcohol use: Not on file  ?? Drug use: Not on file  ?? Sexual activity: Not on file  ?Other Topics Concern  ?? Not on file  ?Social History Narrative  ?? Not on file  ? ?Social Determinants of Health  ? ?Financial Resource Strain: Not on file  ?Food Insecurity: Not on file  ?Transportation Needs: Not on file  ?Physical Activity: Not on file  ?Stress: Not on file  ?Social Connections: Not on file  ? ?No family history on  file. ?Not on File ?Current Outpatient Medications  ?Medication Sig Dispense Refill  ?? doxepin (SINEQUAN) 150 MG capsule Take 150 mg by mouth at bedtime.    ?? HYDROcodone-acetaminophen (NORCO/VICODIN) 5-325 MG tablet TAKE 1-2 TABLETS BY MOUTH EVERY 6 HOURS AS NEEDED FOR MODERATE PAIN (Patient not taking: Reported on 07/25/2021) 30 tablet 0  ?? lisinopril (ZESTRIL) 40 MG tablet Take 40 mg by mouth daily.    ? ?No current facility-administered medications for this visit.  ? ?No results found. ? ?Review of Systems:   ?A ROS was performed including pertinent positives and negatives as documented in the HPI. ? ?Physical Exam :   ?Constitutional: NAD and appears stated age ?Neurological: Alert and oriented ?Psych: Appropriate affect and cooperative ?There were no vitals taken for this visit.  ? ?Comprehensive Musculoskeletal Exam:   ? ?  ?Musculoskeletal Exam  ?Gait Normal  ?Alignment Normal  ? Right Left  ?Inspection Normal Normal  ?Palpation    ?Tenderness None patellofemoral  ?Crepitus None None  ?Effusion  moderate  ?Range of Motion    ?Extension 0 0  ?Flexion 135 135  ?Strength    ?Extension 5/5 5/5  ?Flexion 5/5 5/5  ?Ligament Exam     ?Generalized Laxity  No No  ?Lachman Negative Negative   ?Pivot Shift Negative Negative  ?Anterior Drawer Negative Negative  ?Valgus at 0 Negative Negative  ?Valgus at 20 Negative Negative  ?Varus at 0 0 0  ?Varus at 20   0 0  ?Posterior Drawer at 90 0 0  ?Vascular/Lymphatic Exam    ?Edema None None  ?Venous Stasis Changes No No  ?Distal Circulation Normal Normal  ?Neurologic    ?Light Touch Sensation Intact Intact  ?Special Tests:   ? ? ? ?Imaging:   ?Xray (4 views left knee): ?Normal ? ?MRI (left knee): ?There is fluid and edema about the lateral head of the gastroc tendon ? ?I personally reviewed and interpreted the radiographs. ? ? ?Assessment:   ?76 year old male with left knee pain after a direct fall on the knee with posterior knee pain and swelling. At today's visit I  discussed with him that this is likely a bony type contusion given his direct fall.  I have recommended aspiration of the fluid and injection with steroid and hopefully to give him some pain relief.  I will plan to see him back in 1 month for reassessment if not feeling completely better.  I have advised him on compressive wraps as well. ?Plan :   ? ?-Return to clinic in 1 month as needed ? ? ? ?Procedure Note ? ?Patient: Ian Warner             ?Date of Birth: 10/30/1945           ?MRN: [id number]             ?Visit Date: 09/25/2021 ? ?Procedures: ?Visit Diagnoses:  ?1. Gastrocnemius tendon tear, left, initial encounter   ? ? ?Large Joint Inj: L knee on 09/25/2021 12:08 PM ?Indications: pain ?Details: 22 G 1.5 in needle, ultrasound-guided anterior approach ? ?Arthrogram: No ? ?Medications: 4 mL lidocaine 1 %; 80 mg triamcinolone acetonide 40 MG/ML ?Outcome: tolerated well, no immediate complications ?Procedure, treatment alternatives, risks and benefits explained, specific risks discussed. Consent was given by the patient. Immediately prior to procedure a time out was called to verify the correct patient, procedure, equipment, support staff and site/side marked as required. Patient was prepped and draped in the usual sterile fashion.  ? ? ? ? ? ? ?I personally saw and evaluated the patient, and participated in the management and treatment plan. ? ?Huel Cote, MD ?Attending Physician, Orthopedic Surgery ? ?This document was dictated using Conservation officer, historic buildings. A reasonable attempt at proof reading has been made to minimize errors. ?

## 2021-10-09 ENCOUNTER — Ambulatory Visit (HOSPITAL_BASED_OUTPATIENT_CLINIC_OR_DEPARTMENT_OTHER): Payer: Medicare PPO | Admitting: Orthopaedic Surgery

## 2023-01-03 IMAGING — DX DG KNEE COMPLETE 4+V*L*
4 series · 4 of 4 positions shown · non-contrast
Comparison: Left knee MRI 07/06/2021

CLINICAL DATA: Chronic left knee pain.

EXAM:
LEFT KNEE - COMPLETE 4+ VIEW

[knee ap]
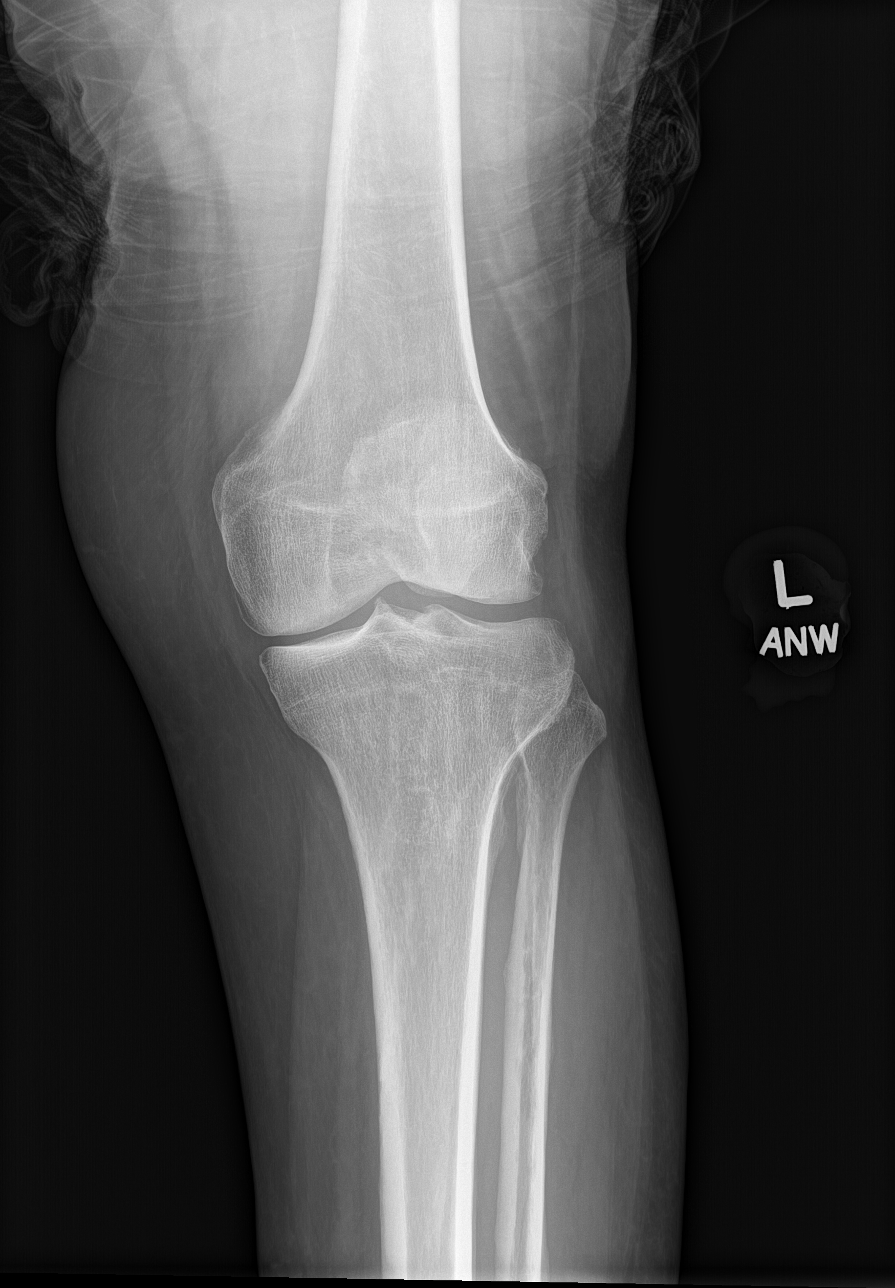

[knee lat]
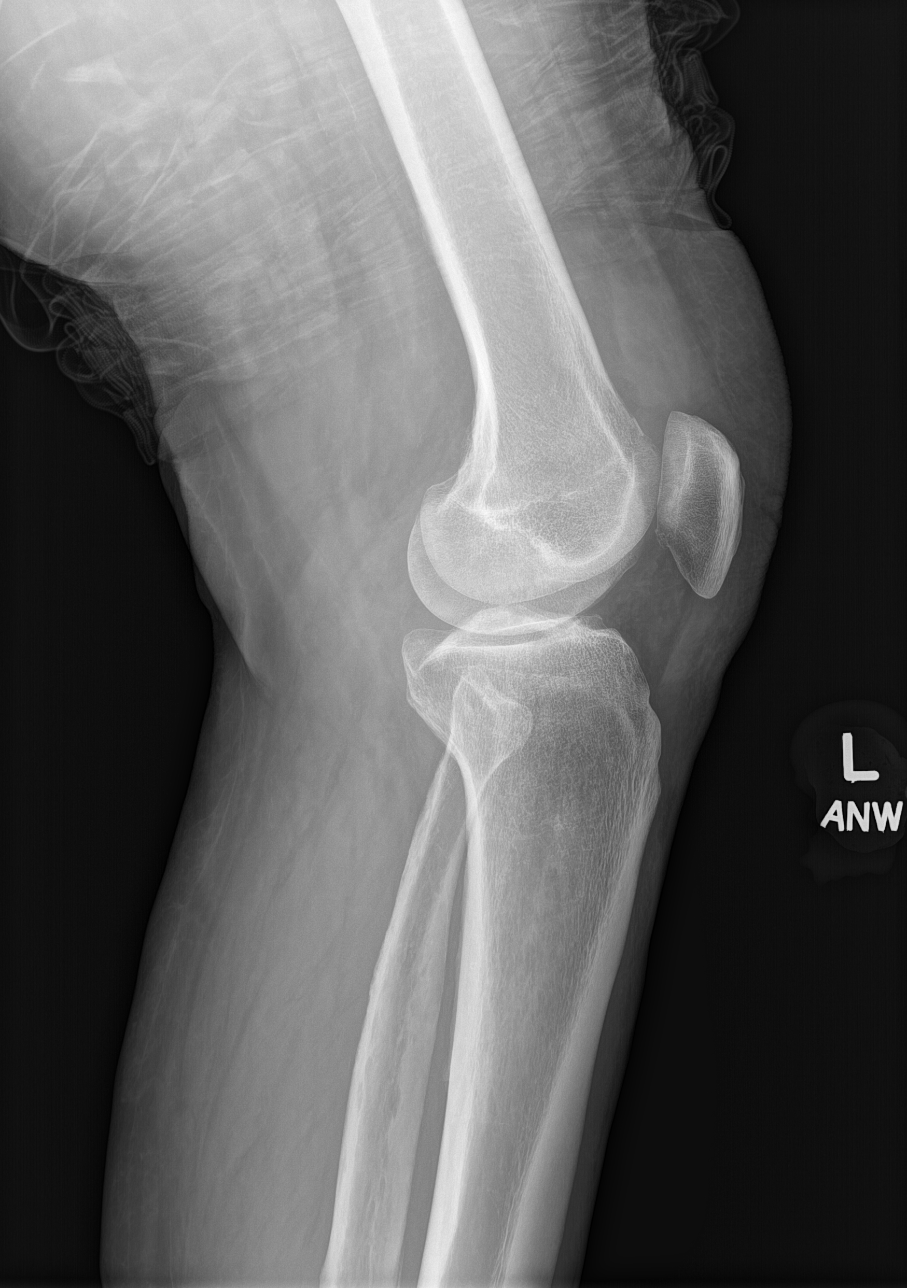

[patella skyline]
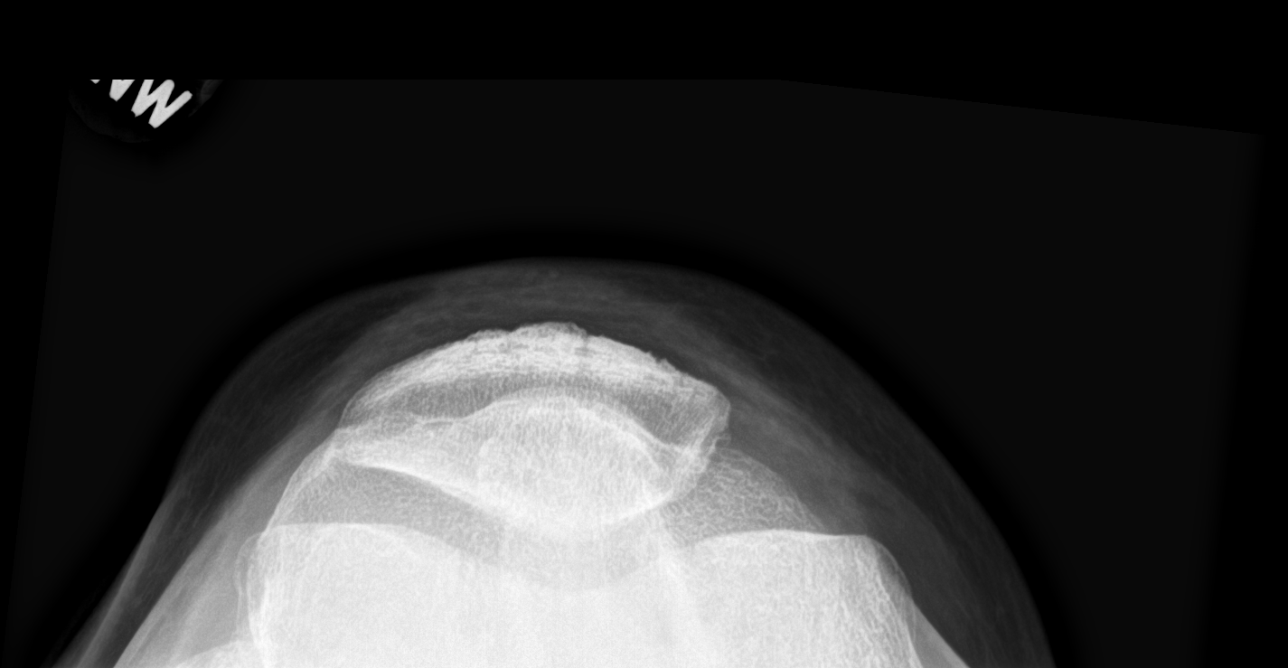

[knee tunnel]
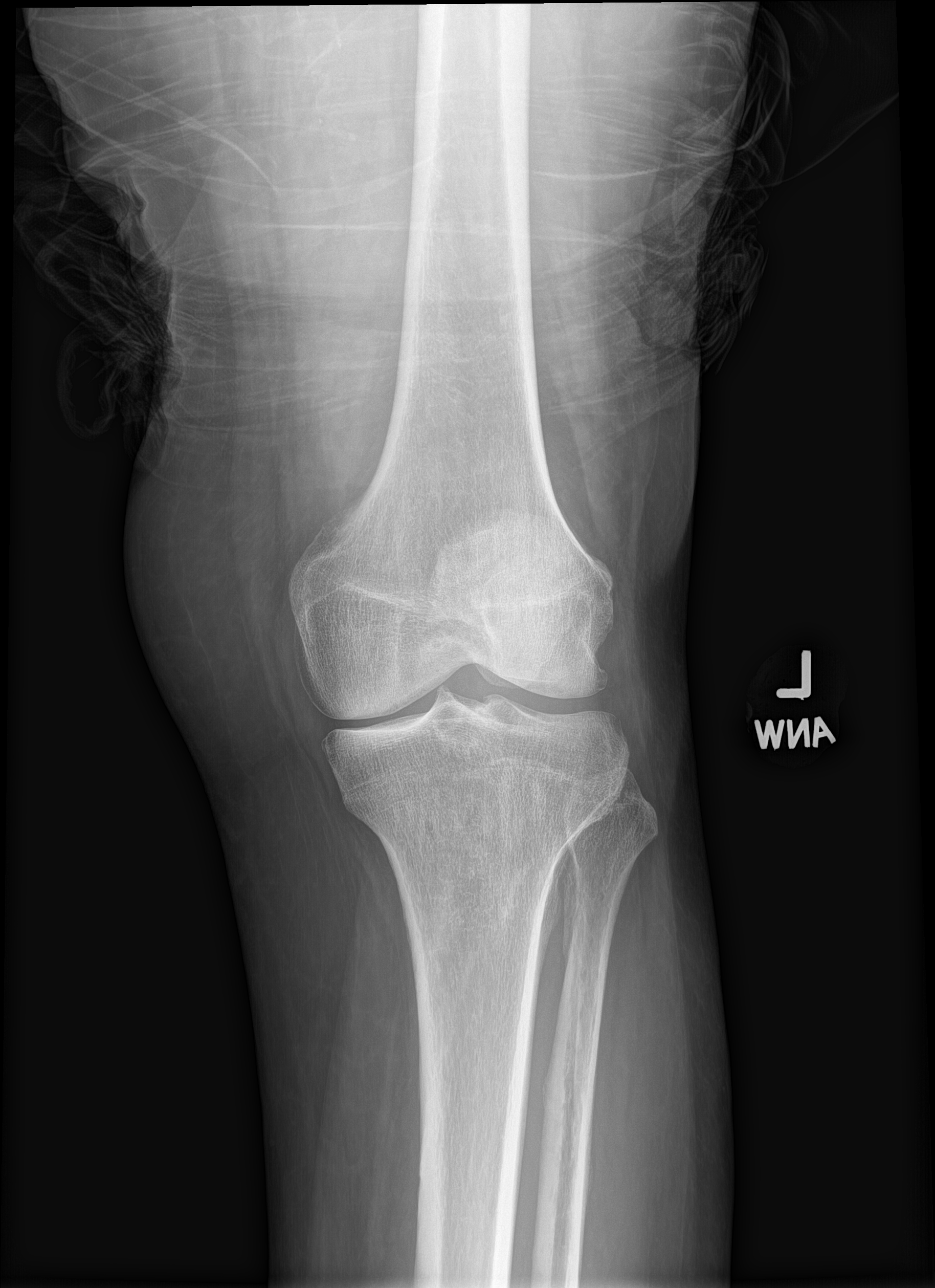

[4 of 4 positions shown; findings below may reference images not displayed]

FINDINGS: Medial tibiofemoral joint space narrowing. Trace lateral patellar
spurring. No fracture, erosion, or focal bone abnormality. Small to
moderate knee joint effusion.
IMPRESSION: Mild degenerative change with medial tibiofemoral joint space
narrowing and trace lateral patellar spurring. Small to moderate
knee joint effusion.

## 2023-09-30 ENCOUNTER — Ambulatory Visit (HOSPITAL_BASED_OUTPATIENT_CLINIC_OR_DEPARTMENT_OTHER): Admitting: Orthopaedic Surgery

## 2023-09-30 ENCOUNTER — Ambulatory Visit (HOSPITAL_BASED_OUTPATIENT_CLINIC_OR_DEPARTMENT_OTHER)

## 2023-09-30 DIAGNOSIS — G8929 Other chronic pain: Secondary | ICD-10-CM

## 2023-09-30 DIAGNOSIS — M5442 Lumbago with sciatica, left side: Secondary | ICD-10-CM | POA: Diagnosis not present

## 2023-09-30 MED ORDER — METHOCARBAMOL 500 MG PO TABS: 500.0000 mg | ORAL_TABLET | Freq: Three times a day (TID) | ORAL | 5 refills | Status: AC

## 2023-09-30 NOTE — Progress Notes (Signed)
 Chief Complaint: Left knee pain        History of Present Illness:    09/25/2021: Presents today with worsening radiating lower back pain.  He does have a history of lumbar stenosis for which she has previously had multiple injections with Dr. Ellery Guthrie.  He is here today as he is having significant radiating back pain with occasional numbness into both legs although worse on the left.  The pain does go down the posterior aspect of the buttocks.  He has had 4 injections in the past   Ian Warner is a 78 y.o. male patient previously seen with Dr. Lucienne Ryder who has persistently had left knee pain for several years now.  He did previously undergo medial meniscal debridement without any relief.  He enjoys regularly walking about 3 miles a day although is limited as result of the knee pain.  He has had injections in the past which helped for a small period of time.  He has done physical therapy and home exercises which have not given him significant relief.  He was previously evaluated by neurosurgical team who did not recommend any specific intervention for the back.  Here today for further assessment and evaluation.       Surgical History:   Left knee arthroscopy with medial meniscal debridement 2021 with Dr. Lucienne Ryder   PMH/PSH/Family History/Social History/Meds/Allergies:   No past medical history on file.     No past surgical history on file.     Social History         Socioeconomic History   Marital status: Married      Spouse name: Not on file   Number of children: Not on file   Years of education: Not on file   Highest education level: Not on file  Occupational History   Not on file  Tobacco Use   Smoking status: Not on file   Smokeless tobacco: Not on file  Substance and Sexual Activity   Alcohol use: Not on file   Drug use: Not on file   Sexual activity: Not on file  Other Topics Concern   Not on file  Social History Narrative   Not on file     Social Determinants of Health    Financial Resource Strain: Not on file  Food Insecurity: Not on file  Transportation Needs: Not on file  Physical Activity: Not on file  Stress: Not on file  Social Connections: Not on file    No family history on file.     Allergies  Not on File         Current Outpatient Medications  Medication Sig Dispense Refill   doxepin (SINEQUAN) 150 MG capsule Take 150 mg by mouth at bedtime.       HYDROcodone -acetaminophen  (NORCO/VICODIN) 5-325 MG tablet TAKE 1-2 TABLETS BY MOUTH EVERY 6 HOURS AS NEEDED FOR MODERATE PAIN (Patient not taking: Reported on 07/25/2021) 30 tablet 0   lisinopril (ZESTRIL) 40 MG tablet Take 40 mg by mouth daily.          No current facility-administered medications for this visit.      Imaging Results (Last 48 hours)  No results found.     Review of Systems:   A ROS was performed including pertinent positives and negatives as documented in the HPI.   Physical Exam :   Constitutional: NAD and appears stated age Neurological: Alert and oriented Psych: Appropriate affect and cooperative There were no vitals taken for this  visit.    Comprehensive Musculoskeletal Exam:           Musculoskeletal Exam  Gait Normal  Alignment Normal    Right Left  Inspection Normal Normal  Palpation      Tenderness None patellofemoral  Crepitus None None  Effusion   moderate  Range of Motion      Extension 0 0  Flexion 135 135  Strength      Extension 5/5 5/5  Flexion 5/5 5/5  Ligament Exam        Generalized Laxity No No  Lachman Negative Negative   Pivot Shift Negative Negative  Anterior Drawer Negative Negative  Valgus at 0 Negative Negative  Valgus at 20 Negative Negative  Varus at 0 0 0  Varus at 20   0 0  Posterior Drawer at 90 0 0  Vascular/Lymphatic Exam      Edema None None  Venous Stasis Changes No No  Distal Circulation Normal Normal  Neurologic      Light Touch Sensation Intact Intact  Special Tests:      Pain radiating down bilateral lower extremities and buttocks with forward flexion of the trunk   Imaging:   Xray (4 views left knee): Normal   MRI (left knee): There is fluid and edema about the lateral head of the gastroc tendon   I personally reviewed and interpreted the radiographs.     Assessment:   78 year old male with a flareup of lumbar stenosis after a significant event where he was moving several heavy pavers.  He has had injections in the past which were quite helpful.  At this time I would like to plan to proceed with a lumbar epidural injection we will plan for a referral to Dr. Daisey Dryer for this.  Also like to get him engaged with physical therapy and we will plan to send Robaxin  to his pharmacy for flareups as well Plan :     -Return to clinic as needed

## 2023-10-06 NOTE — Therapy (Unsigned)
 OUTPATIENT PHYSICAL THERAPY THORACOLUMBAR EVALUATION   Patient Name: Ian Warner MRN: [id number] DOB:10/09/1945, 78 y.o., male Today's Date: 10/07/2023  END OF SESSION:  PT End of Session - 10/07/23 1245     Visit Number 1    Number of Visits 16    Date for PT Re-Evaluation 12/02/23    Authorization Type [employer]    PT Start Time 1015    PT Stop Time 1104    PT Time Calculation (min) 49 min    Activity Tolerance Patient tolerated treatment well             History reviewed. No pertinent past medical history. History reviewed. No pertinent surgical history. Patient Active Problem List   Diagnosis Date Noted   Status post arthroscopy of left knee 05/16/2020    PCP: Dr Genetta Kenning Corrington  REFERRING PROVIDER: Dr Wilhelmenia Harada  REFERRING DIAG: Chronic LBP with L sided sciatica  Rationale for Evaluation and Treatment: Rehabilitation  THERAPY DIAG:  Chronic bilateral low back pain with left-sided sciatica  Other symptoms and signs involving the musculoskeletal system  Other abnormalities of gait and mobility  Muscle weakness (generalized)  ONSET DATE: 07/11/23  SUBJECTIVE:                                                                                                                                                                                           SUBJECTIVE STATEMENT: Patient has had LBP off and on for several years. He has had gradually increasing pain in the past several years. In the past year he has had bilat leg pain in the past years. He was diagnosed with gastrocnemius strain and given exercises which helped. He has not been able to do the exercises in the past few weeks due to back pain. Had a fall the first of February landing on the L knee and twisted the L ankle. The ankle pain resolved but he has had persistent pain in the LB and L LE. Symptoms were improving until a couple of weeks ago when patient was working in yard - lifting 30-50 pavers  weighing 20-30 pounds. He is now having increased pain limiting functional activities   PERTINENT HISTORY:  Lumbar stenosis; chronic LBP; spinal injections x 4; L knee scope 2021 for medial meniscal debridement  PAIN:  Are you having pain? Yes: NPRS scale: 3-4/10; worst in the past 5-6/10  Pain location: bilat LBP - down bilat legs about 5 am  Pain description: sharp; nagging  Aggravating factors: lying down; sitting; lifting; standing in one place  Relieving factors: sitting in recliner   PRECAUTIONS: None   RED FLAGS: None  WEIGHT BEARING RESTRICTIONS: No  FALLS:  Has patient fallen in last 6 months? Yes. 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  DIAGNOSTIC FINDINGS:  Lumbar xrays 09/30/23 - no results Knee MRI 07/06/21 - IMPRESSION: 1. 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 2. Moderate knee effusion and moderate-sized Baker's cyst. 3. Mild fibrosis in Hoffa's fat pad. 4. Degeneration in the posterior horn medial meniscus without a well-defined tear. 5. Moderate to prominent degenerative chondral thinning in the medial compartment. Mild chondral thinning in the lateral compartment. 6. Mild patellar tendinopathy  PATIENT SURVEYS:  Modified Oswestry 22/50; 44%    COGNITION: Overall cognitive status: Within functional limits for tasks assessed     SENSATION: Lower left leg numbness at times for 3-4 years  MUSCLE  LENGTH: Hamstrings: Right 65 deg; Left 60 deg Thomas test: tight bilat LEs L > R   POSTURE: rounded shoulders, forward head, decreased lumbar lordosis, increased thoracic kyphosis, flexed trunk , weight shift right, and LE's in ER L > R   PALPATION: Tenderness through lumbar spine L > R; tight hip flexors; large rectus diastasis   LUMBAR ROM:   AROM eval  Flexion 65%  Extension 0 through lumbar spine   Right lateral flexion 30% pull  Left lateral flexion 30% pull  Right rotation 20%   Left rotation 15%    (Blank rows = not tested)  LOWER EXTREMITY ROM:     Active  Right eval Left eval  Hip flexion    Hip extension limited limited  Hip abduction    Hip adduction    Hip internal rotation limited limited  Hip external rotation limited limited  Knee flexion    Knee extension    Ankle dorsiflexion    Ankle plantarflexion    Ankle inversion    Ankle eversion     (Blank rows = not tested)  LOWER EXTREMITY MMT:    MMT Right eval Left eval  Hip flexion 4+ 4  Hip extension 4- 3+  Hip abduction 4 4-  Hip adduction    Hip internal rotation    Hip external rotation    Knee flexion 5 5  Knee extension 5 5  Ankle dorsiflexion    Ankle plantarflexion    Ankle inversion    Ankle eversion     (Blank rows = not tested)  LUMBAR SPECIAL TESTS:  Straight leg raise test: Negative and Slump test: Negative  FUNCTIONAL TESTS:  5 times sit to stand: 12.59 sec use of UE's on thighs SLS; L 2 sec; R 4 sec  Heel raise bilat okay Toe raise some difficulty  GAIT: Distance walked: 40 ft Assistive device utilized: None Level of assistance: Complete Independence Comments: flexed forward; LE's in ER; wide based gait pattern  TREATMENT DATE: 10/07/23  Evaluation findings Modification recliner sitting w/pillow Use of noodle in sitting  HEP - see below  PATIENT EDUCATION:  Education details: POC; HEP  Person educated: Patient Education method: Programmer, multimedia, Demonstration, Actor cues, Verbal cues, and Handouts Education comprehension: verbalized understanding, returned demonstration, verbal cues required, tactile cues required, and needs further education  HOME EXERCISE PROGRAM: Access Code: [facility] URL: [email or web address] Date: 10/07/2023 Prepared by: Pricella Gaugh  Exercises - Prone Press Up  - 2 x daily - 7 x weekly - 1 sets - 10 reps - 2-3 sec  hold - Prone Press Up On Elbows  - 2 x daily - 7 x weekly - 1 sets - 3 reps - 30 sec  hold - Prone Gluteal Sets  - 2 x daily - 7 x weekly - 1 sets - 10 reps - 5-10 sec  hold - Standing Hip Extension with Counter Support  - 2 x daily - 7 x weekly - 1-2 sets - 10 reps - 3-5 sec  hold  Patient Education - Hospital doctor - Office Posture  ASSESSMENT:  CLINICAL IMPRESSION: Patient is a 78 y.o. male who was seen today for physical therapy evaluation and treatment for chronic LBP and L sided sciatica. He has a history of back pain and L knee pain for several years. He underwent arthroscopic L knee surgery in 2021 but has had some persistent pain since that time. Current back pain has been present for two and a half months following a fall. Symptoms initially improved but patient irritated back ~ 2-3 weeks ago lifting pavers in his yard. He presents with poor posture and alignment; abnormal gait with flexed forward posture; limited trunk and LE mobility/ROM; core and LE weakness; large rectus diastasis; limited functional activities; pain on a daily basis. Patient will benefit from PT to address problems identified.    OBJECTIVE IMPAIRMENTS: Abnormal gait, decreased activity tolerance, decreased balance, decreased mobility, decreased ROM, decreased strength, increased fascial restrictions, improper body mechanics, postural dysfunction, and pain.   ACTIVITY  LIMITATIONS: carrying, lifting, sitting, standing, squatting, sleeping, transfers, and locomotion level  PARTICIPATION LIMITATIONS: yard work  PERSONAL FACTORS: Behavior pattern, Fitness, Past/current experiences, Time since onset of injury/illness/exacerbation, and comorbidities: poor posture and alignment; poor physical conditioning; weak core are also affecting patient's functional outcome.   REHAB POTENTIAL: Good  CLINICAL DECISION MAKING: Evolving/moderate complexity  EVALUATION COMPLEXITY: Moderate   GOALS: Goals reviewed with patient? Yes  SHORT TERM GOALS: Target date: 11/04/2023   Independent in initial HEP  Baseline: Goal status: INITIAL  2.  Patient will demonstrate improve transitional movements; transfers with greater ease Baseline:  Goal status: INITIAL   LONG TERM GOALS: Target date: 12/02/2023   Decrease pain by 50-60% allowing patient to return to more normal functional activities  Baseline:  Goal status: INITIAL  2.  Increase strength core with patient to tolerate 20 minutes for strengthening and stabilization with minimal to no increase in pain  Baseline:  Goal status: INITIAL  3.  Improve tolerance for functional activities with patient returning to household chores and yard work with minimal to no increase in pain Baseline:  Goal status: INITIAL  4.  Increase strength bilat LE's to 4/5 to 5/5  Baseline:  Goal status: INITIAL  5.  Independent in HEP; including aquatic program as indicated  Baseline:  Goal status: INITIAL  6.  Improve Modified Owestry score by 10-15 points  Baseline: 22/50; 44% Goal status: INITIAL  PLAN:  PT FREQUENCY: 2x/week  PT DURATION: 8 weeks  PLANNED INTERVENTIONS: 97110-Therapeutic exercises, 97530- Therapeutic activity, [id number]- Neuromuscular re-education, 97535- Self Care, 16109- Manual  therapy, [id number]- Gait training, 16109- Aquatic Therapy, Patient/Family education, Taping, Dry Needling, and Joint  mobilization.  PLAN FOR NEXT SESSION: review and progress with exercises; continue spine care and body mechanics education; manual work and modalities as indicated    Vincenzo Stave P Emilie Carp, PT 10/07/2023, 1:37 PM

## 2023-10-07 ENCOUNTER — Encounter: Payer: Self-pay | Admitting: Rehabilitative and Restorative Service Providers"

## 2023-10-07 ENCOUNTER — Other Ambulatory Visit: Payer: Self-pay

## 2023-10-07 ENCOUNTER — Ambulatory Visit: Attending: Orthopaedic Surgery | Admitting: Rehabilitative and Restorative Service Providers"

## 2023-10-07 DIAGNOSIS — M5442 Lumbago with sciatica, left side: Secondary | ICD-10-CM | POA: Diagnosis present

## 2023-10-07 DIAGNOSIS — R29898 Other symptoms and signs involving the musculoskeletal system: Secondary | ICD-10-CM | POA: Diagnosis present

## 2023-10-07 DIAGNOSIS — G8929 Other chronic pain: Secondary | ICD-10-CM | POA: Insufficient documentation

## 2023-10-07 DIAGNOSIS — M6281 Muscle weakness (generalized): Secondary | ICD-10-CM | POA: Insufficient documentation

## 2023-10-07 DIAGNOSIS — R2689 Other abnormalities of gait and mobility: Secondary | ICD-10-CM | POA: Insufficient documentation

## 2023-10-19 ENCOUNTER — Ambulatory Visit: Attending: Orthopaedic Surgery

## 2023-10-19 DIAGNOSIS — R29898 Other symptoms and signs involving the musculoskeletal system: Secondary | ICD-10-CM | POA: Diagnosis present

## 2023-10-19 DIAGNOSIS — R2689 Other abnormalities of gait and mobility: Secondary | ICD-10-CM | POA: Insufficient documentation

## 2023-10-19 DIAGNOSIS — M6281 Muscle weakness (generalized): Secondary | ICD-10-CM | POA: Insufficient documentation

## 2023-10-19 DIAGNOSIS — M5442 Lumbago with sciatica, left side: Secondary | ICD-10-CM | POA: Insufficient documentation

## 2023-10-19 DIAGNOSIS — G8929 Other chronic pain: Secondary | ICD-10-CM | POA: Diagnosis present

## 2023-10-19 NOTE — Therapy (Signed)
 OUTPATIENT PHYSICAL THERAPY THORACOLUMBAR TREATMENT   Patient Name: Ian Warner MRN: [id number] DOB:[birth year], 78 y.o., male Today's Date: 10/19/2023  END OF SESSION:  PT End of Session - 10/19/23 1448     Visit Number 2    Number of Visits 16    Date for PT Re-Evaluation 12/02/23    Authorization Type [employer]    Authorization Time Period 16 VISITS APPROVED FOR PT 10/07/2023-12/02/2023    Authorization - Visit Number 2    Authorization - Number of Visits 16    PT Start Time 1450    Activity Tolerance Patient tolerated treatment well    Behavior During Therapy [facility] for tasks assessed/performed             History reviewed. No pertinent past medical history. History reviewed. No pertinent surgical history. Patient Active Problem List   Diagnosis Date Noted   Status post arthroscopy of left knee 05/16/2020    PCP: Dr Genetta Kenning Corrington  REFERRING PROVIDER: Dr Wilhelmenia Harada  REFERRING DIAG: Chronic LBP with L sided sciatica  Rationale for Evaluation and Treatment: Rehabilitation  THERAPY DIAG:  Chronic bilateral low back pain with left-sided sciatica  Other symptoms and signs involving the musculoskeletal system  Other abnormalities of gait and mobility  Muscle weakness (generalized)  ONSET DATE: 07/11/23  SUBJECTIVE:                                                                                                                                                                                           SUBJECTIVE STATEMENT:   EVAL: Patient has had LBP off and on for several years. He has had gradually increasing pain in the past several years. In the past year he has had bilat leg pain in the past years. He was diagnosed with gastrocnemius strain and given exercises which helped. He has not been able to do the exercises in the past few weeks due to back pain. Had a fall the first of February landing on the L knee and twisted the L ankle. The ankle pain resolved  but he has had persistent pain in the LB and L LE. Symptoms were improving until a couple of weeks ago when patient was working in yard - lifting 30-50 pavers weighing 20-30 pounds. He is now having increased pain limiting functional activities   PERTINENT HISTORY:  Lumbar stenosis; chronic LBP; spinal injections x 4; L knee scope 2021 for medial meniscal debridement  PAIN:  Are you having pain? Yes: NPRS scale: 3-4/10; worst in the past 5-6/10  Pain location: bilat LBP - down bilat legs about 5 am  Pain description: sharp; nagging  Aggravating factors: lying down; sitting; lifting; standing in one place  Relieving factors: sitting in recliner   PRECAUTIONS: None   RED FLAGS: None   WEIGHT BEARING RESTRICTIONS: No  FALLS:  Has patient fallen in last 6 months? Yes. 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  DIAGNOSTIC FINDINGS:  Lumbar xrays 09/30/23 - no results Knee MRI 07/06/21 - IMPRESSION: 1. 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 2. Moderate knee effusion and moderate-sized Baker's cyst. 3. Mild fibrosis in Hoffa's fat pad. 4. Degeneration in the posterior horn medial meniscus without a well-defined tear. 5. Moderate to prominent degenerative chondral thinning in the medial compartment. Mild chondral thinning in the lateral compartment. 6. Mild patellar tendinopathy  PATIENT SURVEYS:  Modified Oswestry 22/50; 44%     COGNITION: Overall cognitive status: Within functional limits for tasks assessed     SENSATION: Lower left leg numbness at times for 3-4 years  MUSCLE LENGTH: Hamstrings: Right 65 deg; Left 60 deg Thomas test: tight bilat LEs L > R   POSTURE: rounded shoulders, forward head, decreased lumbar lordosis, increased thoracic kyphosis, flexed trunk , weight shift right, and LE's in ER L > R   PALPATION: Tenderness through lumbar spine L > R; tight hip flexors; large rectus diastasis   LUMBAR ROM:   AROM eval  Flexion 65%  Extension 0 through lumbar spine   Right lateral flexion 30% pull  Left lateral flexion 30% pull  Right rotation 20%   Left rotation 15%    (Blank rows = not tested)  LOWER EXTREMITY ROM:     Active  Right eval Left eval  Hip flexion    Hip extension limited limited  Hip abduction    Hip adduction    Hip internal rotation limited limited  Hip external rotation limited limited  Knee flexion    Knee extension    Ankle dorsiflexion    Ankle plantarflexion    Ankle inversion    Ankle eversion     (Blank rows = not tested)  LOWER EXTREMITY MMT:    MMT Right eval Left eval  Hip flexion 4+ 4  Hip extension 4- 3+  Hip abduction 4 4-  Hip adduction    Hip internal rotation    Hip external rotation    Knee flexion 5 5  Knee extension 5 5  Ankle dorsiflexion    Ankle plantarflexion    Ankle inversion    Ankle eversion     (Blank rows = not tested)  LUMBAR SPECIAL TESTS:  Straight leg raise test: Negative and Slump test: Negative  FUNCTIONAL TESTS:  5 times sit to stand: 12.59 sec use of UE's on thighs SLS; L 2 sec; R 4 sec  Heel raise bilat okay Toe raise some difficulty  GAIT: Distance walked: 40 ft Assistive device utilized: None Level of assistance: Complete Independence Comments: flexed forward; LE's in ER; wide based gait pattern   [facility] Adult PT Treatment:  DATE:  10/19/2023 Therapeutic Exercise: Diaphragmatic breathing Core marching + breathing cues Bridges + breathing cues Prone press up on elbows x6  Prone on elbows 5x10" Supine sciatic nerve glide x10 (bil) Bent over hip extension at counter x10 (bil) Neuromuscular re-ed: Side Lying: Leg arc lifts x10 Clamshells + RTB 2x10 Hydrants + RTB Hydrant into hip extension + RTB x10   TREATMENT DATE: 10/07/23  Evaluation findings Modification recliner sitting w/pillow Use of noodle in sitting  HEP - see below                                                                                                                                   PATIENT EDUCATION:  Education details: Updated HEP  Person educated: Patient Education method: Explanation, Demonstration, Tactile cues, Verbal cues, and Handouts Education comprehension: verbalized understanding, returned demonstration, verbal cues required, tactile cues required, and needs further education  HOME EXERCISE PROGRAM: Access Code: [facility] URL: [email or web address] Date: 10/19/2023 Prepared by: Sims Duck  Exercises - Prone Press Up  - 2 x daily - 7 x weekly - 1 sets - 10 reps - 2-3 sec  hold - Prone Press Up On Elbows  - 2 x daily - 7 x weekly - 1 sets - 3 reps - 30 sec  hold - Prone Gluteal Sets  - 2 x daily - 7 x weekly - 1 sets - 10 reps - 5-10 sec  hold - Standing Hip Extension with Counter Support  - 2 x daily - 7 x weekly - 1-2 sets - 10 reps - 3-5 sec  hold - Clam with Resistance  - 1 x daily - 7 x weekly - 3 sets - 10 reps - Sidelying Bent Knee Lift at 45 Degrees  - 1 x daily - 7 x weekly - 3 sets - 10 reps - Sidelying Hip Abduction and Extension with Loop Band  - 1 x daily - 7 x weekly - 3 sets - 10 reps - Beginner Bridge  - 1 x daily - 7 x weekly - 3 sets - 10 reps - Supine Sciatic Nerve Glide  - 2 x daily - 7 x weekly - 1 sets - 10 reps  Patient Education - Hospital doctor - Office  Posture  ASSESSMENT:  CLINICAL IMPRESSION: Noted increased tension in upper body; patient had difficulty with diaphragmatic breathing mechanics. Lumbar tension noted with poor glute activation during supine bridges. HEP reviewed and added with glute strengthening exercises. Frequent cueing and visual demonstration needed for proper form and mechanics, as well as frequent cueing to breath due to valsalva tendencies.  EVAL: Patient is a 78 y.o. male who was seen today for physical therapy evaluation and treatment for chronic LBP and L sided sciatica. He has a history of back pain and L knee pain for several years. He underwent arthroscopic L knee surgery in 2021 but has had some persistent pain since that time.  Current back pain has been present for two and a half months following a fall. Symptoms initially improved but patient irritated back ~ 2-3 weeks ago lifting pavers in his yard. He presents with poor posture and alignment; abnormal gait with flexed forward posture; limited trunk and LE mobility/ROM; core and LE weakness; large rectus diastasis; limited functional activities; pain on a daily basis. Patient will benefit from PT to address problems identified.    OBJECTIVE IMPAIRMENTS: Abnormal gait, decreased activity tolerance, decreased balance, decreased mobility, decreased ROM, decreased strength, increased fascial restrictions, improper body mechanics, postural dysfunction, and pain.   ACTIVITY LIMITATIONS: carrying, lifting, sitting, standing, squatting, sleeping, transfers, and locomotion level  PARTICIPATION LIMITATIONS: yard work  PERSONAL FACTORS: Behavior pattern, Fitness, Past/current experiences, Time since onset of injury/illness/exacerbation, and comorbidities: poor posture and alignment; poor physical conditioning; weak core are also affecting patient's functional outcome.   REHAB POTENTIAL: Good  CLINICAL DECISION MAKING: Evolving/moderate complexity  EVALUATION COMPLEXITY:  Moderate   GOALS: Goals reviewed with patient? Yes  SHORT TERM GOALS: Target date: 11/04/2023   Independent in initial HEP  Baseline: Goal status: INITIAL  2.  Patient will demonstrate improve transitional movements; transfers with greater ease Baseline:  Goal status: INITIAL   LONG TERM GOALS: Target date: 12/02/2023   Decrease pain by 50-60% allowing patient to return to more normal functional activities  Baseline:  Goal status: INITIAL  2.  Increase strength core with patient to tolerate 20 minutes for strengthening and stabilization with minimal to no increase in pain  Baseline:  Goal status: INITIAL  3.  Improve tolerance for functional activities with patient returning to household chores and yard work with minimal to no increase in pain Baseline:  Goal status: INITIAL  4.  Increase strength bilat LE's to 4/5 to 5/5  Baseline:  Goal status: INITIAL  5.  Independent in HEP; including aquatic program as indicated  Baseline:  Goal status: INITIAL  6.  Improve Modified Owestry score by 10-15 points  Baseline: 22/50; 44% Goal status: INITIAL  PLAN:  PT FREQUENCY: 2x/week  PT DURATION: 8 weeks  PLANNED INTERVENTIONS: 97110-Therapeutic exercises, 97530- Therapeutic activity, [id number]- Neuromuscular re-education, 97535- Self Care, 16109- Manual therapy, [phone]- Gait training, [phone]- Aquatic Therapy, Patient/Family education, Taping, Dry Needling, and Joint mobilization.  PLAN FOR NEXT SESSION: review and progress with exercises; continue spine care and body mechanics education; manual work and modalities as indicated    Flint Hummer, PTA 10/19/2023, 2:49 PM

## 2023-10-20 ENCOUNTER — Ambulatory Visit: Admitting: Physical Medicine and Rehabilitation

## 2023-10-20 ENCOUNTER — Other Ambulatory Visit: Payer: Self-pay

## 2023-10-20 VITALS — BP 135/83 | HR 85

## 2023-10-20 DIAGNOSIS — M5416 Radiculopathy, lumbar region: Secondary | ICD-10-CM

## 2023-10-20 MED ORDER — METHYLPREDNISOLONE ACETATE 40 MG/ML IJ SUSP
40.0000 mg | Freq: Once | INTRAMUSCULAR | Status: AC
Start: 2023-10-20 — End: 2023-10-20
  Administered 2023-10-20: 40 mg

## 2023-10-20 NOTE — Progress Notes (Signed)
 Pain Scale   Average Pain 3 Patient advising he has increased pain when standing, but walking does decrease his pain. Patient also advising that at night he wakes up in pain radiating bilaterally to both legs and he has to get up and walk to get relief.        +Driver, -BT, -Dye Allergies.

## 2023-10-20 NOTE — Patient Instructions (Signed)

## 2023-10-22 ENCOUNTER — Encounter: Payer: Self-pay | Admitting: Rehabilitative and Restorative Service Providers"

## 2023-10-22 ENCOUNTER — Ambulatory Visit: Admitting: Rehabilitative and Restorative Service Providers"

## 2023-10-22 DIAGNOSIS — G8929 Other chronic pain: Secondary | ICD-10-CM | POA: Diagnosis not present

## 2023-10-22 DIAGNOSIS — R29898 Other symptoms and signs involving the musculoskeletal system: Secondary | ICD-10-CM

## 2023-10-22 DIAGNOSIS — M6281 Muscle weakness (generalized): Secondary | ICD-10-CM

## 2023-10-22 DIAGNOSIS — R2689 Other abnormalities of gait and mobility: Secondary | ICD-10-CM

## 2023-10-22 NOTE — Therapy (Signed)
 OUTPATIENT PHYSICAL THERAPY THORACOLUMBAR TREATMENT   Patient Name: Ian Warner MRN: [id number] DOB:Oct 25, 1945, 78 y.o., male Today's Date: 10/22/2023  END OF SESSION:  PT End of Session - 10/22/23 1457     Visit Number 3    Number of Visits 16    Date for PT Re-Evaluation 12/02/23    Authorization Type [employer]    Authorization - Visit Number 3    Authorization - Number of Visits 16    Progress Note Due on Visit 10    PT Start Time 1450    PT Stop Time 1530    PT Time Calculation (min) 40 min    Activity Tolerance Patient tolerated treatment well             History reviewed. No pertinent past medical history. History reviewed. No pertinent surgical history. Patient Active Problem List   Diagnosis Date Noted   Status post arthroscopy of left knee 05/16/2020    PCP: Dr Genetta Kenning Corrington  REFERRING PROVIDER: Dr Wilhelmenia Harada  REFERRING DIAG: Chronic LBP with L sided sciatica  Rationale for Evaluation and Treatment: Rehabilitation  THERAPY DIAG:  Chronic bilateral low back pain with left-sided sciatica  Other symptoms and signs involving the musculoskeletal system  Other abnormalities of gait and mobility  Muscle weakness (generalized)  ONSET DATE: 07/11/23  SUBJECTIVE:                                                                                                                                                                                           SUBJECTIVE STATEMENT: Had an epidural Tuesday, 10/20/23 which was very painful. Not sure of the results yet. MD said it may be up to a week to know the results of the injection. One of the most irritating problems is pain in both legs about 5 in the morning. He only had pain in the L knee this morning. Feels the L knee pain has increased because he has stopped doing exercises for the L knee.   EVAL: Patient has had LBP off and on for several years. He has had gradually increasing pain in the past several  years. In the past year he has had bilat leg pain in the past years. He was diagnosed with gastrocnemius strain and given exercises which helped. He has not been able to do the exercises in the past few weeks due to back pain. Had a fall the first of February landing on the L knee and twisted the L ankle. The ankle pain resolved but he has had persistent pain in the LB and L LE. Symptoms were improving until a couple of weeks  ago when patient was working in yard - lifting 30-50 pavers weighing 20-30 pounds. He is now having increased pain limiting functional activities   PERTINENT HISTORY:  Lumbar stenosis; chronic LBP; spinal injections x 4; L knee scope 2021 for medial meniscal debridement  PAIN:  Are you having pain? Yes: NPRS scale: 2-3/10; worst in the past 5-6/10  Pain location: bilat LBP - down bilat legs about 5 am  Pain description: sharp; nagging  Aggravating factors: lying down; sitting; lifting; standing in one place  Relieving factors: sitting in recliner   PRECAUTIONS: None   RED FLAGS: None   WEIGHT BEARING RESTRICTIONS: No  FALLS:  Has patient fallen in last 6 months? Yes. 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  DIAGNOSTIC FINDINGS:  Lumbar xrays 09/30/23 - no results Knee MRI 07/06/21 - IMPRESSION: 1. 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 2. Moderate knee effusion and moderate-sized Baker's cyst. 3. Mild fibrosis in Hoffa's fat pad. 4.  Degeneration in the posterior horn medial meniscus without a well-defined tear. 5. Moderate to prominent degenerative chondral thinning in the medial compartment. Mild chondral thinning in the lateral compartment. 6. Mild patellar tendinopathy  PATIENT SURVEYS:  Modified Oswestry 22/50; 44%    COGNITION: Overall cognitive status: Within functional limits for tasks assessed     SENSATION: Lower left leg numbness at times for 3-4 years  MUSCLE LENGTH: Hamstrings: Right 65 deg; Left 60 deg Thomas test: tight bilat LEs L > R   POSTURE: rounded shoulders, forward head, decreased lumbar lordosis, increased thoracic kyphosis, flexed trunk , weight shift right, and LE's in ER L > R   PALPATION: Tenderness through lumbar spine L > R; tight hip flexors; large rectus diastasis   LUMBAR ROM:   AROM eval  Flexion 65%  Extension 0 through lumbar spine   Right lateral flexion 30% pull  Left lateral flexion 30% pull  Right rotation 20%   Left rotation 15%    (Blank rows = not tested)  LOWER EXTREMITY ROM:     Active  Right eval Left eval  Hip flexion    Hip extension limited limited  Hip abduction    Hip adduction    Hip internal rotation limited limited  Hip external rotation limited limited  Knee flexion    Knee extension    Ankle dorsiflexion    Ankle plantarflexion    Ankle inversion    Ankle eversion     (Blank rows = not tested)  LOWER EXTREMITY MMT:    MMT Right eval Left eval  Hip flexion 4+ 4  Hip extension 4- 3+  Hip abduction 4 4-  Hip adduction    Hip internal rotation    Hip external rotation    Knee flexion 5 5  Knee extension 5 5  Ankle dorsiflexion    Ankle plantarflexion    Ankle inversion    Ankle eversion     (Blank rows = not tested)  LUMBAR SPECIAL TESTS:  Straight leg  raise test: Negative and Slump test: Negative  FUNCTIONAL TESTS:  5 times sit to stand: 12.59 sec use of UE's on thighs SLS; L 2 sec; R 4 sec  Heel raise bilat  okay Toe raise some difficulty  GAIT: Distance walked: 40 ft Assistive device utilized: None Level of assistance: Complete Independence Comments: flexed forward; LE's in ER; wide based gait pattern  [facility] Adult PT Treatment:                                                DATE: 10/22/2023 Therapeutic Exercise: Nustep L 5 x 10 min  Diaphragmatic breathing Core marching + breathing cues x 20 R/L  Bridges + breathing cues 3 sec x 10 x 2  Prone press up on elbows 2 sec x 10 Prone on elbows 20 sec x 3  Supine sciatic nerve glide x10 (bil) Bent over hip extension at counter x10 (bil) Therapeutic activity:  Side Lying: Hip abduction leading with heel x10 Clamshells + RTB 2x10 Hydrants + RTB  Neuromuscular re-education:   Sitting ant/post pelvic tilt   [facility] Adult PT Treatment:                                                DATE: 10/19/2023 Therapeutic Exercise: Diaphragmatic breathing Core marching + breathing cues Bridges + breathing cues Prone press up on elbows x6  Prone on elbows 5x10" Supine sciatic nerve glide x10 (bil) Bent over hip extension at counter x10 (bil) Neuromuscular re-ed: Side Lying: Leg arc lifts x10 Clamshells + RTB 2x10 Hydrants + RTB Hydrant into hip extension + RTB x10    TREATMENT DATE: 10/07/23  Evaluation findings Modification recliner sitting w/pillow Use of noodle in sitting  HEP - see below                                                                                                                                   PATIENT EDUCATION:  Education details: Updated HEP  Person educated: Patient Education method: Explanation, Demonstration, Tactile cues, Verbal cues, and Handouts Education comprehension: verbalized understanding, returned demonstration, verbal cues required, tactile cues required, and needs further education  HOME EXERCISE PROGRAM: Access Code: [facility] URL: [email or web address] Date: 10/22/2023 Prepared  by: Dionte Blaustein  Exercises - Prone Press Up  - 2 x daily - 7 x weekly - 1 sets - 10 reps - 2-3 sec  hold - Prone Press Up On Elbows  - 2 x daily - 7 x weekly - 1 sets - 3 reps - 30 sec  hold - Prone Gluteal Sets  - 2 x daily - 7 x weekly - 1 sets - 10 reps -  5-10 sec  hold - Standing Hip Extension with Counter Support  - 2 x daily - 7 x weekly - 1-2 sets - 10 reps - 3-5 sec  hold - Clam with Resistance  - 1 x daily - 7 x weekly - 3 sets - 10 reps - Sidelying Bent Knee Lift at 45 Degrees  - 1 x daily - 7 x weekly - 3 sets - 10 reps - Sidelying Hip Abduction and Extension with Loop Band  - 1 x daily - 7 x weekly - 3 sets - 10 reps - Beginner Bridge  - 1 x daily - 7 x weekly - 3 sets - 10 reps - Supine Sciatic Nerve Glide  - 2 x daily - 7 x weekly - 1 sets - 10 reps - Supine Quad Set  - 2 x daily - 7 x weekly - 1 sets - 10 reps - 3 sec  hold - Small Range Straight Leg Raise  - 2 x daily - 7 x weekly - 1 sets - 10 reps - 5 sec  hold  Patient Education - Hospital doctor - Office Posture  ASSESSMENT:  CLINICAL IMPRESSION: Unsure of response to [facility] 10/20/23. Patient continues to have radicular pain in LE's about 5 am in the morning but the pain this morning was only in the L knee. Added some specific exercises for L knee strengthening. Patient has difficulty with diaphragmatic breathing during exercises. Reviewed and progressed with exercises.   EVAL: Patient is a 78 y.o. male who was seen today for physical therapy evaluation and treatment for chronic LBP and L sided sciatica. He has a history of back pain and L knee pain for several years. He underwent arthroscopic L knee surgery in 2021 but has had some persistent pain since that time. Current back pain has been present for two and a half months following a fall. Symptoms initially improved but patient irritated back ~ 2-3 weeks ago lifting pavers in his yard. He presents with poor posture and alignment; abnormal gait with flexed forward  posture; limited trunk and LE mobility/ROM; core and LE weakness; large rectus diastasis; limited functional activities; pain on a daily basis. Patient will benefit from PT to address problems identified.     GOALS: Goals reviewed with patient? Yes  SHORT TERM GOALS: Target date: 11/04/2023   Independent in initial HEP  Baseline: Goal status: INITIAL  2.  Patient will demonstrate improve transitional movements; transfers with greater ease Baseline:  Goal status: INITIAL   LONG TERM GOALS: Target date: 12/02/2023   Decrease pain by 50-60% allowing patient to return to more normal functional activities  Baseline:  Goal status: INITIAL  2.  Increase strength core with patient to tolerate 20 minutes for strengthening and stabilization with minimal to no increase in pain  Baseline:  Goal status: INITIAL  3.  Improve tolerance for functional activities with patient returning to household chores and yard work with minimal to no increase in pain Baseline:  Goal status: INITIAL  4.  Increase strength bilat LE's to 4/5 to 5/5  Baseline:  Goal status: INITIAL  5.  Independent in HEP; including aquatic program as indicated  Baseline:  Goal status: INITIAL  6.  Improve Modified Owestry score by 10-15 points  Baseline: 22/50; 44% Goal status: INITIAL  PLAN:  PT FREQUENCY: 2x/week  PT DURATION: 8 weeks  PLANNED INTERVENTIONS: 97110-Therapeutic exercises, 97530- Therapeutic activity, [id number]- Neuromuscular re-education, 97535- Self Care, 16109- Manual therapy, [id number]- Gait training, [phone]-  Aquatic Therapy, Patient/Family education, Taping, Dry Needling, and Joint mobilization.  PLAN FOR NEXT SESSION: review and progress with exercises; continue spine care and body mechanics education; manual work and modalities as indicated    Anacristina Steffek P Taariq Leitz, PT 10/22/2023, 2:58 PM

## 2023-10-27 ENCOUNTER — Ambulatory Visit: Admitting: Rehabilitative and Restorative Service Providers"

## 2023-10-27 ENCOUNTER — Encounter: Payer: Self-pay | Admitting: Rehabilitative and Restorative Service Providers"

## 2023-10-27 DIAGNOSIS — M6281 Muscle weakness (generalized): Secondary | ICD-10-CM

## 2023-10-27 DIAGNOSIS — R2689 Other abnormalities of gait and mobility: Secondary | ICD-10-CM

## 2023-10-27 DIAGNOSIS — G8929 Other chronic pain: Secondary | ICD-10-CM

## 2023-10-27 DIAGNOSIS — R29898 Other symptoms and signs involving the musculoskeletal system: Secondary | ICD-10-CM

## 2023-10-27 NOTE — Therapy (Signed)
 OUTPATIENT PHYSICAL THERAPY THORACOLUMBAR TREATMENT   Patient Name: Ian Warner MRN: [id number] DOB:1946-03-13, 78 y.o., male Today's Date: 10/27/2023  END OF SESSION:  PT End of Session - 10/27/23 1015     Visit Number 4    Number of Visits 16    Date for PT Re-Evaluation 12/02/23    Authorization Type [employer]    Authorization Time Period 16 VISITS APPROVED FOR PT 10/07/2023-12/02/2023    Authorization - Visit Number 4    Authorization - Number of Visits 16    Progress Note Due on Visit 10    PT Start Time 1015    PT Stop Time 1100    PT Time Calculation (min) 45 min             History reviewed. No pertinent past medical history. History reviewed. No pertinent surgical history. Patient Active Problem List   Diagnosis Date Noted   Status post arthroscopy of left knee 05/16/2020    PCP: Dr Genetta Kenning Corrington  REFERRING PROVIDER: Dr Wilhelmenia Harada  REFERRING DIAG: Chronic LBP with L sided sciatica  Rationale for Evaluation and Treatment: Rehabilitation  THERAPY DIAG:  Chronic bilateral low back pain with left-sided sciatica  Other symptoms and signs involving the musculoskeletal system  Other abnormalities of gait and mobility  Muscle weakness (generalized)  ONSET DATE: 07/11/23  SUBJECTIVE:                                                                                                                                                                                           SUBJECTIVE STATEMENT: Patient reports that his back is feeling "okay". His knee is irritated. Some improvement from the epidural. He is not waking with so much pain anymore. Not waking with pain in both legs about 5 in the morning. He is working on his exercises at home. Doing a lot of yard work. Cut a tree yesterday. Uses a lumbar support when he is working in the yard.    Epidural Tuesday, 10/20/23   EVAL: Patient has had LBP off and on for several years. He has had gradually  increasing pain in the past several years. In the past year he has had bilat leg pain in the past years. He was diagnosed with gastrocnemius strain and given exercises which helped. He has not been able to do the exercises in the past few weeks due to back pain. Had a fall the first of February landing on the L knee and twisted the L ankle. The ankle pain resolved but he has had persistent pain in the LB and L LE. Symptoms were improving until a couple  of weeks ago when patient was working in yard - lifting 30-50 pavers weighing 20-30 pounds. He is now having increased pain limiting functional activities   PERTINENT HISTORY:  Lumbar stenosis; chronic LBP; spinal injections x 4; L knee scope 2021 for medial meniscal debridement  PAIN:  Are you having pain? Yes: NPRS scale: 0/10; worst in the past 5-6/10  Pain location: bilat LBP - down bilat legs about 5 am  Pain description: sharp; nagging  Aggravating factors: lying down; sitting; lifting; standing in one place  Relieving factors: sitting in recliner   PRECAUTIONS: None   WEIGHT BEARING RESTRICTIONS: No  FALLS:  Has patient fallen in last 6 months? Yes. 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  DIAGNOSTIC FINDINGS:  Lumbar xrays 09/30/23 - no results Knee MRI 07/06/21 - IMPRESSION: 1. 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 2. Moderate knee effusion and moderate-sized Baker's cyst. 3. Mild fibrosis in Hoffa's  fat pad. 4. Degeneration in the posterior horn medial meniscus without a well-defined tear. 5. Moderate to prominent degenerative chondral thinning in the medial compartment. Mild chondral thinning in the lateral compartment. 6. Mild patellar tendinopathy  PATIENT SURVEYS:  Modified Oswestry 22/50; 44%    COGNITION: Overall cognitive status: Within functional limits for tasks assessed     SENSATION: Lower left leg numbness at times for 3-4 years  MUSCLE LENGTH: Hamstrings: Right 65 deg; Left 60 deg Thomas test: tight bilat LEs L > R   POSTURE: rounded shoulders, forward head, decreased lumbar lordosis, increased thoracic kyphosis, flexed trunk , weight shift right, and LE's in ER L > R   PALPATION: Tenderness through lumbar spine L > R; tight hip flexors; large rectus diastasis   LUMBAR ROM:   AROM eval  Flexion 65%  Extension 0 through lumbar spine   Right lateral flexion 30% pull  Left lateral flexion 30% pull  Right rotation 20%   Left rotation 15%    (Blank rows = not tested)  LOWER EXTREMITY ROM:     Active  Right eval Left eval  Hip flexion    Hip extension limited limited  Hip abduction    Hip adduction    Hip internal rotation limited limited  Hip external rotation limited limited  Knee flexion    Knee extension    Ankle dorsiflexion    Ankle plantarflexion    Ankle inversion    Ankle eversion     (Blank rows = not tested)  LOWER EXTREMITY MMT:    MMT Right eval Left eval  Hip flexion 4+ 4  Hip extension 4- 3+  Hip abduction 4 4-  Hip adduction    Hip internal rotation    Hip external rotation    Knee flexion 5 5  Knee extension 5 5  Ankle dorsiflexion    Ankle plantarflexion    Ankle inversion    Ankle eversion     (Blank rows = not tested)  LUMBAR SPECIAL TESTS:  Straight leg raise test: Negative  and Slump test: Negative  FUNCTIONAL TESTS:  5 times sit to stand: 12.59 sec use of UE's on thighs SLS; L 2 sec; R 4 sec  Heel  raise bilat okay Toe raise some difficulty  GAIT: Distance walked: 40 ft Assistive device utilized: None Level of assistance: Complete Independence Comments: flexed forward; LE's in ER; wide based gait pattern  [facility] Adult PT Treatment:                                                DATE: 5/202025 Therapeutic Exercise: Nustep L 5 x 10 min  Diaphragmatic breathing Core marching + breathing cues x 20 R/L  Bridges + breathing cues 3 sec x 10 x 2  Prone press up on elbows 2 sec x 10 Prone on elbows 20 sec x 3  Supine sciatic nerve glide x10 (bil)  Therapeutic activity:  Supine  Hip abduction isometric holding one knee moving opposite green TB 3 sec x 10 x 2 sets  Sidelying  Hip abduction leading with heel x10 (difficulty isolating glut med- using ITB more) Standing  Row blue TB 3 sec x 10  Shoulder extension blue TB x 3 sec x 10  Counter plank 30 sec x 3   Neuromuscular re-education:   Sitting ant/post pelvic tilt    [facility] Adult PT Treatment:                                                DATE: 10/22/2023 Therapeutic Exercise: Nustep L 5 x 10 min  Diaphragmatic breathing Core marching + breathing cues x 20 R/L  Bridges + breathing cues 3 sec x 10 x 2  Prone press up on elbows 2 sec x 10 Prone on elbows 20 sec x 3  Supine sciatic nerve glide x10 (bil) Bent over hip extension at counter x10 (bil) Therapeutic activity:  Side Lying: Hip abduction leading with heel x10 Clamshells + RTB 2x10 Hydrants + RTB  Neuromuscular re-education:   Sitting ant/post pelvic tilt   [facility] Adult PT Treatment:                                                DATE: 10/19/2023 Therapeutic Exercise: Diaphragmatic breathing Core marching + breathing cues Bridges + breathing cues Prone press up on elbows x6  Prone on elbows 5x10" Supine sciatic nerve glide x10 (bil) Bent over hip extension at counter x10 (bil) Neuromuscular re-ed: Side Lying: Leg arc lifts x10 Clamshells + RTB 2x10 Hydrants  + RTB Hydrant into hip extension + RTB x10    TREATMENT DATE: 10/07/23  Evaluation findings Modification recliner sitting w/pillow Use of noodle in sitting  HEP - see below  PATIENT EDUCATION:  Education details: Updated HEP  Person educated: Patient Education method: Explanation, Demonstration, Tactile cues, Verbal cues, and Handouts Education comprehension: verbalized understanding, returned demonstration, verbal cues required, tactile cues required, and needs further education  HOME EXERCISE PROGRAM: Access Code: [facility] URL: [email or web address] Date: 10/27/2023 Prepared by: Danika Kluender  Exercises - Prone Press Up  - 2 x daily - 7 x weekly - 1 sets - 10 reps - 2-3 sec  hold - Prone Press Up On Elbows  - 2 x daily - 7 x weekly - 1 sets - 3 reps - 30 sec  hold - Prone Gluteal Sets  - 2 x daily - 7 x weekly - 1 sets - 10 reps - 5-10 sec  hold - Standing Hip Extension with Counter Support  - 2 x daily - 7 x weekly - 1-2 sets - 10 reps - 3-5 sec  hold - Clam with Resistance  - 1 x daily - 7 x weekly - 3 sets - 10 reps - Sidelying Bent Knee Lift at 45 Degrees  - 1 x daily - 7 x weekly - 3 sets - 10 reps - Sidelying Hip Abduction and Extension with Loop Band  - 1 x daily - 7 x weekly - 3 sets - 10 reps - Beginner Bridge  - 1 x daily - 7 x weekly - 3 sets - 10 reps - Supine Sciatic Nerve Glide  - 2 x daily - 7 x weekly - 1 sets - 10 reps - Supine Quad Set  - 2 x daily - 7 x weekly - 1 sets - 10 reps - 3 sec  hold - Small Range Straight Leg Raise  - 2 x daily - 7 x weekly - 1 sets - 10 reps - 5 sec  hold - Standing Bilateral Low Shoulder Row with Anchored Resistance  - 2 x daily - 7 x weekly - 1-3 sets - 10 reps - 2-3 sec  hold - Shoulder extension with resistance - Neutral  - 1 x daily - 7 x weekly - 1-2 sets - 10 reps - 3-5 sec   hold  Patient Education - Hospital doctor - Office Posture  ASSESSMENT:  CLINICAL IMPRESSION: Patient reports a positive response to [facility] 10/20/23, no longer awakening with pain in his legs at 5 am. He continues to have some L knee pain. Patient has difficulty with diaphragmatic breathing during exercises. Reviewed and progressed with exercises. Added resistive exercises with patient in standing. Tolerated exercises well.   EVAL: Patient is a 78 y.o. male who was seen today for physical therapy evaluation and treatment for chronic LBP and L sided sciatica. He has a history of back pain and L knee pain for several years. He underwent arthroscopic L knee surgery in 2021 but has had some persistent pain since that time. Current back pain has been present for two and a half months following a fall. Symptoms initially improved but patient irritated back ~ 2-3 weeks ago lifting pavers in his yard. He presents with poor posture and alignment; abnormal gait with flexed forward posture; limited trunk and LE mobility/ROM; core and LE weakness; large rectus diastasis; limited functional activities; pain on a daily basis. Patient will benefit from PT to address problems identified.     GOALS: Goals reviewed with patient? Yes  SHORT TERM GOALS: Target date: 11/04/2023   Independent in initial HEP  Baseline: Goal status: INITIAL  2.  Patient will demonstrate improve transitional movements; transfers  with greater ease Baseline:  Goal status: INITIAL   LONG TERM GOALS: Target date: 12/02/2023   Decrease pain by 50-60% allowing patient to return to more normal functional activities  Baseline:  Goal status: INITIAL  2.  Increase strength core with patient to tolerate 20 minutes for strengthening and stabilization with minimal to no increase in pain  Baseline:  Goal status: INITIAL  3.  Improve tolerance for functional activities with patient returning to household chores and yard work  with minimal to no increase in pain Baseline:  Goal status: INITIAL  4.  Increase strength bilat LE's to 4/5 to 5/5  Baseline:  Goal status: INITIAL  5.  Independent in HEP; including aquatic program as indicated  Baseline:  Goal status: INITIAL  6.  Improve Modified Owestry score by 10-15 points  Baseline: 22/50; 44% Goal status: INITIAL  PLAN:  PT FREQUENCY: 2x/week  PT DURATION: 8 weeks  PLANNED INTERVENTIONS: 97110-Therapeutic exercises, 97530- Therapeutic activity, [id number]- Neuromuscular re-education, 97535- Self Care, 16109- Manual therapy, [phone]- Gait training, [phone]- Aquatic Therapy, Patient/Family education, Taping, Dry Needling, and Joint mobilization.  PLAN FOR NEXT SESSION: review and progress with exercises; continue spine care and body mechanics education; manual work and modalities as indicated    Marshia Tropea P Santana Edell, PT 10/27/2023, 10:19 AM

## 2023-10-28 NOTE — Procedures (Signed)
 Lumbar Epidural Steroid Injection - Interlaminar Approach with Fluoroscopic Guidance  Patient: Ian Warner      Date of Birth: 23-Jan-1946 MRN: [id number] PCP: Chandler Combs, MD      Visit Date: 10/20/2023   Universal Protocol:     Consent Given By: the patient  Position: PRONE  Additional Comments: Vital signs were monitored before and after the procedure. Patient was prepped and draped in the usual sterile fashion. The correct patient, procedure, and site was verified.   Injection Procedure Details:   Procedure diagnoses: Lumbar radiculopathy [M54.16]   Meds Administered:  Meds ordered this encounter  Medications   methylPREDNISolone  acetate (DEPO-MEDROL ) injection 40 mg     Laterality: Left  Location/Site:  L5-S1  Needle: 3.5 in., 20 ga. Tuohy  Needle Placement: Paramedian epidural  Findings:   -Comments: Excellent flow of contrast into the epidural space.  Procedure Details: Using a paramedian approach from the side mentioned above, the region overlying the inferior lamina was localized under fluoroscopic visualization and the soft tissues overlying this structure were infiltrated with 4 ml. of 1% Lidocaine  without Epinephrine. The Tuohy needle was inserted into the epidural space using a paramedian approach.   The epidural space was localized using loss of resistance along with counter oblique bi-planar fluoroscopic views.  After negative aspirate for air, blood, and CSF, a 2 ml. volume of Isovue-250 was injected into the epidural space and the flow of contrast was observed. Radiographs were obtained for documentation purposes.    The injectate was administered into the level noted above.   Additional Comments:  The patient tolerated the procedure well Dressing: 2 x 2 sterile gauze and Band-Aid    Post-procedure details: Patient was observed during the procedure. Post-procedure instructions were reviewed.  Patient left the clinic in stable condition.

## 2023-10-28 NOTE — Progress Notes (Signed)
 Ian Warner - 78 y.o. male MRN [id number]  Date of birth: 1946-02-28  Office Visit Note: Visit Date: 10/20/2023 PCP: Chandler Combs, MD Referred by: Chandler Combs, MD  Subjective: Chief Complaint  Patient presents with   Lower Back - Pain   HPI:  Ian Warner is a 78 y.o. male who comes in today at the request of Dr. Wilhelmenia Harada for planned Left L5-S1 Lumbar Interlaminar epidural steroid injection with fluoroscopic guidance.  The patient has failed conservative care including home exercise, medications, time and activity modification.  This injection will be diagnostic and hopefully therapeutic.  Please see requesting physician notes for further details and justification.  His MRI does not show any nerve compression and he is getting symptoms in the both legs at times.  Could consider restless leg syndrome.  Could be facet mediated pain with some referral in the legs.  Depending on relief of injection consider facet joint block.   ROS Otherwise per HPI.  Assessment & Plan: Visit Diagnoses:    ICD-10-CM   1. Lumbar radiculopathy  M54.16 XR C-ARM NO REPORT    Epidural Steroid injection    methylPREDNISolone  acetate (DEPO-MEDROL ) injection 40 mg      Plan: No additional findings.   Meds & Orders:  Meds ordered this encounter  Medications   methylPREDNISolone  acetate (DEPO-MEDROL ) injection 40 mg    Orders Placed This Encounter  Procedures   XR C-ARM NO REPORT   Epidural Steroid injection    Follow-up: Return for visit to requesting provider as needed.   Procedures: No procedures performed  Lumbar Epidural Steroid Injection - Interlaminar Approach with Fluoroscopic Guidance  Patient: Ian Warner      Date of Birth: [birth year] MRN: [id number] PCP: Chandler Combs, MD      Visit Date: 10/20/2023   Universal Protocol:     Consent Given By: the patient  Position: PRONE  Additional Comments: Vital signs were monitored before and after the  procedure. Patient was prepped and draped in the usual sterile fashion. The correct patient, procedure, and site was verified.   Injection Procedure Details:   Procedure diagnoses: Lumbar radiculopathy [M54.16]   Meds Administered:  Meds ordered this encounter  Medications   methylPREDNISolone  acetate (DEPO-MEDROL ) injection 40 mg     Laterality: Left  Location/Site:  L5-S1  Needle: 3.5 in., 20 ga. Tuohy  Needle Placement: Paramedian epidural  Findings:   -Comments: Excellent flow of contrast into the epidural space.  Procedure Details: Using a paramedian approach from the side mentioned above, the region overlying the inferior lamina was localized under fluoroscopic visualization and the soft tissues overlying this structure were infiltrated with 4 ml. of 1% Lidocaine  without Epinephrine. The Tuohy needle was inserted into the epidural space using a paramedian approach.   The epidural space was localized using loss of resistance along with counter oblique bi-planar fluoroscopic views.  After negative aspirate for air, blood, and CSF, a 2 ml. volume of Isovue-250 was injected into the epidural space and the flow of contrast was observed. Radiographs were obtained for documentation purposes.    The injectate was administered into the level noted above.   Additional Comments:  The patient tolerated the procedure well Dressing: 2 x 2 sterile gauze and Band-Aid    Post-procedure details: Patient was observed during the procedure. Post-procedure instructions were reviewed.  Patient left the clinic in stable condition.   Clinical History: MRI LUMBAR SPINE WITHOUT CONTRAST  TECHNIQUE: Multiplanar, multisequence MR imaging of the  lumbar spine was performed. No intravenous contrast was administered.  COMPARISON: 06/04/2020, images only.  FINDINGS: Segmentation: Standard.  Alignment: Stable.  Vertebrae: Stable vertebral body heights with degenerative  endplate irregularity. Marrow signal is mildly heterogeneous as before. No substantial marrow edema. No suspicious osseous lesion.  Conus medullaris and cauda equina: Conus extends to the L2 level. Conus and cauda equina appear normal.  Paraspinal and other soft tissues: Unremarkable.  Disc levels:  L1-L2: No canal or foraminal stenosis.  L2-L3: Small left foraminal protrusion. Mild facet arthropathy with effusion on the left. No canal or foraminal stenosis.  L3-L4: Small left foraminal protrusion. Mild facet arthropathy with joint effusions. No canal or foraminal stenosis.  L4-L5: Disc bulge with endplate osteophytic ridging eccentric to the right. Moderate facet arthropathy. No canal or left foraminal stenosis. Minor right foraminal stenosis.  L5-S1: Disc bulge with endplate osteophytic ridging. Mild facet arthropathy. No canal stenosis. Minor foraminal stenosis.  IMPRESSION: Multilevel degenerative changes as detailed above without high-grade stenosis. Multilevel facet arthropathy.   Electronically Signed By: Geannie Keener M.D. On: 04/18/2021 14:18     Objective:  VS:  HT:    WT:   BMI:     BP:135/83  HR:85bpm  TEMP: ( )  RESP:  Physical Exam Vitals and nursing note reviewed.  Constitutional:      General: He is not in acute distress.    Appearance: Normal appearance. He is not ill-appearing.  HENT:     Head: Normocephalic and atraumatic.     Right Ear: External ear normal.     Left Ear: External ear normal.     Nose: No congestion.  Eyes:     Extraocular Movements: Extraocular movements intact.  Cardiovascular:     Rate and Rhythm: Normal rate.     Pulses: Normal pulses.  Pulmonary:     Effort: Pulmonary effort is normal. No respiratory distress.  Abdominal:     General: There is no distension.     Palpations: Abdomen is soft.  Musculoskeletal:        General: No tenderness or signs of injury.     Cervical back: Neck supple.     Right lower leg:  No edema.     Left lower leg: No edema.     Comments: Patient has good distal strength without clonus.  Skin:    Findings: No erythema or rash.  Neurological:     General: No focal deficit present.     Mental Status: He is alert and oriented to person, place, and time.     Sensory: No sensory deficit.     Motor: No weakness or abnormal muscle tone.     Coordination: Coordination normal.  Psychiatric:        Mood and Affect: Mood normal.        Behavior: Behavior normal.      Imaging: No results found.

## 2023-10-29 ENCOUNTER — Ambulatory Visit

## 2023-11-03 ENCOUNTER — Encounter: Payer: Self-pay | Admitting: Rehabilitative and Restorative Service Providers"

## 2023-11-03 ENCOUNTER — Ambulatory Visit: Admitting: Rehabilitative and Restorative Service Providers"

## 2023-11-03 DIAGNOSIS — R29898 Other symptoms and signs involving the musculoskeletal system: Secondary | ICD-10-CM

## 2023-11-03 DIAGNOSIS — G8929 Other chronic pain: Secondary | ICD-10-CM | POA: Diagnosis not present

## 2023-11-03 DIAGNOSIS — M6281 Muscle weakness (generalized): Secondary | ICD-10-CM

## 2023-11-03 DIAGNOSIS — R2689 Other abnormalities of gait and mobility: Secondary | ICD-10-CM

## 2023-11-03 NOTE — Therapy (Addendum)
 OUTPATIENT PHYSICAL THERAPY THORACOLUMBAR TREATMENT PHYSICAL THERAPY DISCHARGE SUMMARY  Visits from Start of Care: 5  Current functional level related to goals / functional outcomes: See progress note for discharge status    Remaining deficits: Good improvement - needs to continue HEP    Education / Equipment: HEP    Patient agrees to discharge. Patient goals were partially met. Patient is being discharged due to being pleased with the current functional level.  [name] PT, MPH 02/16/24 3:22 PM     Patient Name: Ian Warner MRN: [id number] DOB:September 11, 1945, 78 y.o., male Today's Date: 11/03/2023  END OF SESSION:  PT End of Session - 11/03/23 1016     Visit Number 5    Number of Visits 16    Date for PT Re-Evaluation 12/02/23    Authorization Type [employer]    Authorization Time Period 16 VISITS APPROVED FOR PT 10/07/2023-12/02/2023    Authorization - Visit Number 5    Authorization - Number of Visits 16    Progress Note Due on Visit 10    PT Start Time 1015    PT Stop Time 1100    PT Time Calculation (min) 45 min    Activity Tolerance Patient tolerated treatment well             History reviewed. No pertinent past medical history. History reviewed. No pertinent surgical history. Patient Active Problem List   Diagnosis Date Noted   Status post arthroscopy of left knee 05/16/2020    PCP: Dr Coye Corrington  REFERRING PROVIDER: Dr Elspeth Parker  REFERRING DIAG: Chronic LBP with L sided sciatica  Rationale for Evaluation and Treatment: Rehabilitation  THERAPY DIAG:  Chronic bilateral low back pain with left-sided sciatica  Other symptoms and signs involving the musculoskeletal system  Other abnormalities of gait and mobility  Muscle weakness (generalized)  ONSET DATE: 07/11/23  SUBJECTIVE:                                                                                                                                                                                            SUBJECTIVE STATEMENT: Patient reports that his back is feeling pretty good. He has worked some in the yard but is being selective about what he does. He had a dump truck load of mulch but hired someone to spread the mulch. He is working on his exercises at home. Not waking with pain in both legs in the morning.    Epidural Tuesday, 10/20/23   EVAL: Patient has had LBP off and on for several years. He has had gradually increasing pain in the past several years. In  the past year he has had bilat leg pain in the past years. He was diagnosed with gastrocnemius strain and given exercises which helped. He has not been able to do the exercises in the past few weeks due to back pain. Had a fall the first of February landing on the L knee and twisted the L ankle. The ankle pain resolved but he has had persistent pain in the LB and L LE. Symptoms were improving until a couple of weeks ago when patient was working in yard - lifting 30-50 pavers weighing 20-30 pounds. He is now having increased pain limiting functional activities   PERTINENT HISTORY:  Lumbar stenosis; chronic LBP; spinal injections x 4; L knee scope 2021 for medial meniscal debridement  PAIN:  Are you having pain? Yes: NPRS scale: 0/10; worst in the past 5-6/10  Pain location: bilat LBP - down bilat legs about 5 am  Pain description: sharp; nagging  Aggravating factors: lying down; sitting; lifting; standing in one place  Relieving factors: sitting in recliner   PRECAUTIONS: None   WEIGHT BEARING RESTRICTIONS: No  FALLS:  Has patient fallen in last 6 months? Yes. 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  DIAGNOSTIC FINDINGS:  Lumbar xrays 09/30/23 - no results Knee MRI 07/06/21 - IMPRESSION: 1. 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 2. Moderate knee effusion and moderate-sized Baker's cyst. 3. Mild fibrosis in Hoffa's fat pad. 4. Degeneration in the posterior horn medial meniscus without a well-defined tear. 5. Moderate to prominent degenerative chondral thinning in the medial compartment. Mild chondral thinning in the lateral compartment. 6. Mild patellar tendinopathy  PATIENT SURVEYS:  Modified Oswestry 22/50; 44%    COGNITION: Overall cognitive status: Within functional limits for tasks assessed     SENSATION: Lower left leg numbness at times for 3-4 years  MUSCLE LENGTH: Hamstrings: Right 65 deg; Left 60 deg Thomas test: tight bilat LEs L > R   POSTURE: rounded shoulders, forward head, decreased lumbar lordosis, increased thoracic kyphosis, flexed trunk , weight shift right, and LE's in ER L > R   PALPATION: Tenderness through lumbar spine L > R; tight hip flexors; large rectus diastasis   LUMBAR ROM:   AROM eval  Flexion 65%  Extension 0 through lumbar spine   Right lateral flexion 30% pull  Left lateral flexion 30% pull  Right rotation 20%   Left rotation 15%    (Blank rows = not tested)  LOWER EXTREMITY ROM:     Active  Right eval Left eval  Hip flexion    Hip extension limited limited  Hip abduction    Hip adduction    Hip internal rotation limited limited  Hip external rotation limited limited  Knee flexion    Knee extension    Ankle dorsiflexion    Ankle plantarflexion    Ankle inversion    Ankle eversion     (Blank rows = not tested)  LOWER EXTREMITY MMT:    MMT Right eval  Left eval  Hip flexion 4+ 4   Hip extension 4- 3+  Hip abduction 4 4-  Hip adduction    Hip internal rotation    Hip external rotation    Knee flexion 5 5  Knee extension 5 5  Ankle dorsiflexion    Ankle plantarflexion    Ankle inversion    Ankle eversion     (Blank rows = not tested)  LUMBAR SPECIAL TESTS:  Straight leg raise test: Negative and Slump test: Negative  FUNCTIONAL TESTS:  5 times sit to stand: 12.59 sec use of UE's on thighs SLS; L 2 sec; R 4 sec  Heel raise bilat okay Toe raise some difficulty  GAIT: Distance walked: 40 ft Assistive device utilized: None Level of assistance: Complete Independence Comments: flexed forward; LE's in ER; wide based gait pattern  [facility] Adult PT Treatment:                                                DATE: 5/272025 Therapeutic Exercise: Nustep L 5 x 10 min  Diaphragmatic breathing Core marching + breathing cues x 20 R/L  Bridges + breathing cues 3 sec x 10 x 2 (second set with green TB distal thighs)  Prone press up 2 sec x 10 increased pain with exhale and sag - did not add  Supine sciatic nerve glide x10 (bil) Hamstring stretch 30 sec x 2 R/L  Therapeutic activity:  Supine  Hip abduction isometric holding one knee moving opposite green TB 3 sec x 10 x 2 sets  Sidelying  Hip abduction leading with heel x10 (difficulty isolating glut med- using ITB more) Standing  Row blue TB 3 sec x 10 x 2 Shoulder extension blue TB x 3 sec x 10 x 2 Counter plank 30 sec x 1; wall plank x 1    [facility] Adult PT Treatment:                                                DATE: 5/202025 Therapeutic Exercise: Nustep L 5 x 10 min  Diaphragmatic breathing Core marching + breathing cues x 20 R/L  Bridges + breathing cues 3 sec x 10 x 2  Prone press up on elbows 2 sec x 10 Prone on elbows 20 sec x 3  Supine sciatic nerve glide x10 (bil)  Therapeutic activity:  Supine  Hip abduction isometric holding one knee moving opposite green TB 3 sec x 10 x 2 sets  Sidelying  Hip  abduction leading with heel x10 (difficulty isolating glut med- using ITB more) Standing  Row blue TB 3 sec x 10  Shoulder extension blue TB x 3 sec x 10  Counter plank 30 sec x 3   Neuromuscular re-education:   Sitting ant/post pelvic tilt  PATIENT EDUCATION:  Education details: Updated HEP  Person educated: Patient Education method: Explanation, Demonstration, Tactile cues, Verbal cues, and Handouts Education comprehension: verbalized understanding, returned demonstration, verbal cues required, tactile cues required, and needs further education  HOME EXERCISE PROGRAM: Access Code: [facility] URL: [email or web address] Date: 11/03/2023 Prepared by: Bailea Beed  Exercises - Prone Press Up  - 2 x daily - 7 x weekly - 1 sets - 10 reps - 2-3 sec  hold - Prone Press Up On Elbows  - 2 x daily - 7 x weekly - 1 sets - 3 reps - 30 sec  hold - Prone Gluteal Sets  - 2 x daily - 7 x weekly - 1 sets - 10 reps - 5-10 sec  hold - Standing Hip Extension with Counter Support  - 2 x daily - 7 x weekly - 1-2 sets - 10 reps - 3-5 sec  hold - Clam with Resistance  - 1 x daily - 7 x weekly - 3 sets - 10 reps - Sidelying Bent Knee Lift at 45 Degrees  - 1 x daily - 7 x weekly - 3 sets - 10 reps - Sidelying Hip Abduction and Extension with Loop Band  - 1 x daily - 7 x weekly - 3 sets - 10 reps - Beginner Bridge  - 1 x daily - 7 x weekly - 3 sets - 10 reps - Supine Sciatic Nerve Glide  - 2 x daily - 7 x weekly - 1 sets - 10 reps - Supine Quad Set  - 2 x daily - 7 x weekly - 1 sets - 10 reps - 3 sec  hold - Small Range Straight Leg Raise  - 2 x daily - 7 x weekly - 1 sets - 10 reps - 5 sec  hold - Standing Bilateral Low Shoulder Row with Anchored Resistance  - 2 x daily - 7 x weekly - 1-3 sets - 10 reps - 2-3 sec  hold - Shoulder extension with resistance - Neutral  - 1 x daily - 7 x  weekly - 1-2 sets - 10 reps - 3-5 sec  hold - Supine Bridge with Resistance Band  - 2 x daily - 7 x weekly - 1-2 sets - 10 reps - 5-10 sec  hold - Plank on Counter  - 2 x daily - 7 x weekly - 1 sets - 3 reps - 30 sec  hold  Patient Education - Hospital doctor - Office Posture  ASSESSMENT:  CLINICAL IMPRESSION: Jodie seems more aware of activities to avoid in the yard that likely irritate the back pain. Patient feels the [facility] 10/20/23 helped with the leg pain and he is no longer awakening with pain in his legs in the morning. He continues to have some L knee pain. Patient has difficulty with diaphragmatic breathing during exercises. Reviewed and progressed with exercises. Continued resistive exercises with patient in standing. Tolerated exercises well.   EVAL: Patient is a 78 y.o. male who was seen today for physical therapy evaluation and treatment for chronic LBP and L sided sciatica. He has a history of back pain and L knee pain for several years. He underwent arthroscopic L knee surgery in 2021 but has had some persistent pain since that time. Current back pain has been present for two and a half months following a fall. Symptoms initially improved but patient irritated back ~ 2-3 weeks ago lifting pavers in his yard. He presents with poor posture and alignment; abnormal  gait with flexed forward posture; limited trunk and LE mobility/ROM; core and LE weakness; large rectus diastasis; limited functional activities; pain on a daily basis. Patient will benefit from PT to address problems identified.     GOALS: Goals reviewed with patient? Yes  SHORT TERM GOALS: Target date: 11/04/2023   Independent in initial HEP  Baseline: Goal status: INITIAL  2.  Patient will demonstrate improve transitional movements; transfers with greater ease Baseline:  Goal status: INITIAL   LONG TERM GOALS: Target date: 12/02/2023   Decrease pain by 50-60% allowing patient to return to more normal  functional activities  Baseline:  Goal status: INITIAL  2.  Increase strength core with patient to tolerate 20 minutes for strengthening and stabilization with minimal to no increase in pain  Baseline:  Goal status: INITIAL  3.  Improve tolerance for functional activities with patient returning to household chores and yard work with minimal to no increase in pain Baseline:  Goal status: INITIAL  4.  Increase strength bilat LE's to 4/5 to 5/5  Baseline:  Goal status: INITIAL  5.  Independent in HEP; including aquatic program as indicated  Baseline:  Goal status: INITIAL  6.  Improve Modified Owestry score by 10-15 points  Baseline: 22/50; 44% Goal status: INITIAL  PLAN:  PT FREQUENCY: 2x/week  PT DURATION: 8 weeks  PLANNED INTERVENTIONS: 97110-Therapeutic exercises, 97530- Therapeutic activity, [id number]- Neuromuscular re-education, 97535- Self Care, [id number]- Manual therapy, [phone]- Gait training, [phone]- Aquatic Therapy, Patient/Family education, Taping, Dry Needling, and Joint mobilization.  PLAN FOR NEXT SESSION: review and progress with exercises; continue spine care and body mechanics education; manual work and modalities as indicated    Orren Pietsch P Juliannah Ohmann, PT 11/03/2023, 10:17 AM

## 2023-11-05 ENCOUNTER — Encounter: Admitting: Rehabilitative and Restorative Service Providers"

## 2024-04-11 ENCOUNTER — Encounter: Payer: Self-pay | Admitting: Radiology
